# Patient Record
Sex: Female | Born: 1950
Health system: Southern US, Community
[De-identification: ages and names within clinical notes are randomized; demographics above are authoritative.]

## PROBLEM LIST (undated history)

## (undated) DIAGNOSIS — M5126 Other intervertebral disc displacement, lumbar region: Secondary | ICD-10-CM

## (undated) DIAGNOSIS — M7120 Synovial cyst of popliteal space [Baker], unspecified knee: Secondary | ICD-10-CM

## (undated) DIAGNOSIS — I809 Phlebitis and thrombophlebitis of unspecified site: Secondary | ICD-10-CM

## (undated) DIAGNOSIS — R112 Nausea with vomiting, unspecified: Secondary | ICD-10-CM

## (undated) DIAGNOSIS — Z9889 Other specified postprocedural states: Secondary | ICD-10-CM

## (undated) DIAGNOSIS — M51369 Other intervertebral disc degeneration, lumbar region without mention of lumbar back pain or lower extremity pain: Secondary | ICD-10-CM

## (undated) DIAGNOSIS — M5136 Other intervertebral disc degeneration, lumbar region: Secondary | ICD-10-CM

## (undated) DIAGNOSIS — I82409 Acute embolism and thrombosis of unspecified deep veins of unspecified lower extremity: Secondary | ICD-10-CM

## (undated) DIAGNOSIS — J189 Pneumonia, unspecified organism: Secondary | ICD-10-CM

## (undated) DIAGNOSIS — J45909 Unspecified asthma, uncomplicated: Secondary | ICD-10-CM

## (undated) DIAGNOSIS — I10 Essential (primary) hypertension: Secondary | ICD-10-CM

## (undated) HISTORY — DX: Acute embolism and thrombosis of unspecified deep veins of unspecified lower extremity: I82.409

## (undated) HISTORY — PX: APPENDECTOMY: SHX54

## (undated) HISTORY — PX: JOINT REPLACEMENT: SHX530

## (undated) HISTORY — PX: OTHER SURGICAL HISTORY: SHX169

## (undated) HISTORY — DX: Essential (primary) hypertension: I10

## (undated) HISTORY — PX: TUBAL LIGATION: SHX77

## (undated) HISTORY — PX: CHOLECYSTECTOMY: SHX55

## (undated) HISTORY — DX: Unspecified asthma, uncomplicated: J45.909

## (undated) HISTORY — PX: BREAST BIOPSY: SHX20

## (undated) HISTORY — PX: BREAST EXCISIONAL BIOPSY: SUR124

## (undated) HISTORY — PX: BREAST CYST EXCISION: SHX579

---

## 1993-11-05 HISTORY — PX: BRAIN TUMOR EXCISION: SHX577

## 2005-11-05 DIAGNOSIS — M7120 Synovial cyst of popliteal space [Baker], unspecified knee: Secondary | ICD-10-CM

## 2005-11-05 HISTORY — DX: Synovial cyst of popliteal space (Baker), unspecified knee: M71.20

## 2005-11-24 ENCOUNTER — Encounter: Admission: RE | Admit: 2005-11-24 | Discharge: 2005-11-24 | Payer: Self-pay | Admitting: Unknown Physician Specialty

## 2005-12-05 ENCOUNTER — Encounter: Admission: RE | Admit: 2005-12-05 | Discharge: 2005-12-05 | Payer: Self-pay | Admitting: Unknown Physician Specialty

## 2005-12-21 ENCOUNTER — Encounter: Admission: RE | Admit: 2005-12-21 | Discharge: 2005-12-21 | Payer: Self-pay | Admitting: Unknown Physician Specialty

## 2006-01-07 ENCOUNTER — Encounter: Admission: RE | Admit: 2006-01-07 | Discharge: 2006-01-07 | Payer: Self-pay | Admitting: Unknown Physician Specialty

## 2010-10-31 ENCOUNTER — Ambulatory Visit (HOSPITAL_COMMUNITY)
Admission: RE | Admit: 2010-10-31 | Discharge: 2010-10-31 | Payer: Self-pay | Source: Home / Self Care | Attending: Ophthalmology | Admitting: Ophthalmology

## 2010-11-26 ENCOUNTER — Encounter: Payer: Self-pay | Admitting: Unknown Physician Specialty

## 2011-06-21 ENCOUNTER — Other Ambulatory Visit: Payer: Self-pay | Admitting: Neurosurgery

## 2011-06-21 DIAGNOSIS — M5020 Other cervical disc displacement, unspecified cervical region: Secondary | ICD-10-CM

## 2011-06-26 ENCOUNTER — Ambulatory Visit
Admission: RE | Admit: 2011-06-26 | Discharge: 2011-06-26 | Disposition: A | Discharge status: Home / Self Care | Payer: PRIVATE HEALTH INSURANCE | Source: Ambulatory Visit | Attending: Neurosurgery | Admitting: Neurosurgery

## 2011-06-26 VITALS — BP 150/75 | HR 65 | Ht 61.0 in | Wt 312.0 lb

## 2011-06-26 DIAGNOSIS — M5020 Other cervical disc displacement, unspecified cervical region: Secondary | ICD-10-CM

## 2011-06-26 MED ORDER — IOHEXOL 300 MG/ML  SOLN
1.0000 mL | Freq: Once | INTRAMUSCULAR | Status: AC | PRN
  Administered 2011-06-26: 1 mL via EPIDURAL

## 2011-06-26 MED ORDER — TRIAMCINOLONE ACETONIDE 40 MG/ML IJ SUSP (RADIOLOGY)
40.0000 mg | Freq: Once | INTRAMUSCULAR | Status: AC
  Administered 2011-06-26: 40 mg via EPIDURAL

## 2011-07-05 ENCOUNTER — Other Ambulatory Visit: Payer: Self-pay | Admitting: Neurosurgery

## 2011-07-05 DIAGNOSIS — M5020 Other cervical disc displacement, unspecified cervical region: Secondary | ICD-10-CM

## 2011-07-06 ENCOUNTER — Ambulatory Visit
Admission: RE | Admit: 2011-07-06 | Discharge: 2011-07-06 | Disposition: A | Discharge status: Home / Self Care | Payer: PRIVATE HEALTH INSURANCE | Source: Ambulatory Visit | Attending: Neurosurgery | Admitting: Neurosurgery

## 2011-07-06 DIAGNOSIS — M5020 Other cervical disc displacement, unspecified cervical region: Secondary | ICD-10-CM

## 2011-07-06 MED ORDER — IOHEXOL 300 MG/ML  SOLN
1.0000 mL | Freq: Once | INTRAMUSCULAR | Status: AC | PRN
  Administered 2011-07-06: 1 mL via EPIDURAL

## 2011-07-06 MED ORDER — TRIAMCINOLONE ACETONIDE 40 MG/ML IJ SUSP (RADIOLOGY)
60.0000 mg | Freq: Once | INTRAMUSCULAR | Status: AC
  Administered 2011-07-06: 60 mg via EPIDURAL

## 2011-09-10 ENCOUNTER — Other Ambulatory Visit: Payer: Self-pay | Admitting: Neurosurgery

## 2011-09-10 DIAGNOSIS — M5020 Other cervical disc displacement, unspecified cervical region: Secondary | ICD-10-CM

## 2011-09-13 ENCOUNTER — Ambulatory Visit
Admission: RE | Admit: 2011-09-13 | Discharge: 2011-09-13 | Disposition: A | Discharge status: Home / Self Care | Payer: PRIVATE HEALTH INSURANCE | Source: Ambulatory Visit | Attending: Neurosurgery | Admitting: Neurosurgery

## 2011-09-13 DIAGNOSIS — M5020 Other cervical disc displacement, unspecified cervical region: Secondary | ICD-10-CM

## 2011-09-13 MED ORDER — IOHEXOL 300 MG/ML  SOLN
1.0000 mL | Freq: Once | INTRAMUSCULAR | Status: AC | PRN
  Administered 2011-09-13: 1 mL via EPIDURAL

## 2011-09-13 MED ORDER — TRIAMCINOLONE ACETONIDE 40 MG/ML IJ SUSP (RADIOLOGY)
60.0000 mg | Freq: Once | INTRAMUSCULAR | Status: AC
  Administered 2011-09-13: 60 mg via EPIDURAL

## 2011-09-13 MED ORDER — ONDANSETRON HCL 4 MG/2ML IJ SOLN: 4.0000 mg | Freq: Four times a day (QID) | INTRAMUSCULAR | Status: DC | PRN

## 2011-11-19 ENCOUNTER — Telehealth: Payer: Self-pay | Admitting: *Deleted

## 2011-11-19 NOTE — Telephone Encounter (Signed)
Received fax stating can't get 1 ml in fir B12. Is it ok to send 10 ml. Sent updated presciption for 10 ml.Marland KitchenMarland Kitchen1/14/13@4 :42pm/LMB

## 2015-11-30 DIAGNOSIS — S92352A Displaced fracture of fifth metatarsal bone, left foot, initial encounter for closed fracture: Secondary | ICD-10-CM | POA: Diagnosis not present

## 2015-12-19 DIAGNOSIS — H40003 Preglaucoma, unspecified, bilateral: Secondary | ICD-10-CM | POA: Diagnosis not present

## 2015-12-21 DIAGNOSIS — H40003 Preglaucoma, unspecified, bilateral: Secondary | ICD-10-CM | POA: Diagnosis not present

## 2015-12-26 DIAGNOSIS — H401131 Primary open-angle glaucoma, bilateral, mild stage: Secondary | ICD-10-CM | POA: Diagnosis not present

## 2015-12-31 DIAGNOSIS — Z6841 Body Mass Index (BMI) 40.0 and over, adult: Secondary | ICD-10-CM | POA: Diagnosis not present

## 2015-12-31 DIAGNOSIS — M170 Bilateral primary osteoarthritis of knee: Secondary | ICD-10-CM | POA: Diagnosis not present

## 2015-12-31 DIAGNOSIS — K219 Gastro-esophageal reflux disease without esophagitis: Secondary | ICD-10-CM | POA: Diagnosis not present

## 2015-12-31 DIAGNOSIS — K589 Irritable bowel syndrome without diarrhea: Secondary | ICD-10-CM | POA: Diagnosis not present

## 2015-12-31 DIAGNOSIS — Z86011 Personal history of benign neoplasm of the brain: Secondary | ICD-10-CM | POA: Diagnosis not present

## 2015-12-31 DIAGNOSIS — I10 Essential (primary) hypertension: Secondary | ICD-10-CM | POA: Diagnosis not present

## 2015-12-31 DIAGNOSIS — J449 Chronic obstructive pulmonary disease, unspecified: Secondary | ICD-10-CM | POA: Diagnosis not present

## 2015-12-31 DIAGNOSIS — F331 Major depressive disorder, recurrent, moderate: Secondary | ICD-10-CM | POA: Diagnosis not present

## 2016-01-25 DIAGNOSIS — S92352K Displaced fracture of fifth metatarsal bone, left foot, subsequent encounter for fracture with nonunion: Secondary | ICD-10-CM | POA: Diagnosis not present

## 2016-01-25 DIAGNOSIS — S92352A Displaced fracture of fifth metatarsal bone, left foot, initial encounter for closed fracture: Secondary | ICD-10-CM | POA: Diagnosis not present

## 2016-01-25 DIAGNOSIS — M79672 Pain in left foot: Secondary | ICD-10-CM | POA: Diagnosis not present

## 2016-02-01 DIAGNOSIS — E038 Other specified hypothyroidism: Secondary | ICD-10-CM | POA: Diagnosis not present

## 2016-02-01 DIAGNOSIS — M160 Bilateral primary osteoarthritis of hip: Secondary | ICD-10-CM | POA: Diagnosis not present

## 2016-02-01 DIAGNOSIS — Z6841 Body Mass Index (BMI) 40.0 and over, adult: Secondary | ICD-10-CM | POA: Diagnosis not present

## 2016-02-01 DIAGNOSIS — H60393 Other infective otitis externa, bilateral: Secondary | ICD-10-CM | POA: Diagnosis not present

## 2016-02-01 DIAGNOSIS — H8113 Benign paroxysmal vertigo, bilateral: Secondary | ICD-10-CM | POA: Diagnosis not present

## 2016-02-01 DIAGNOSIS — K210 Gastro-esophageal reflux disease with esophagitis: Secondary | ICD-10-CM | POA: Diagnosis not present

## 2016-02-01 DIAGNOSIS — M1049 Other secondary gout, multiple sites: Secondary | ICD-10-CM | POA: Diagnosis not present

## 2016-02-01 DIAGNOSIS — I10 Essential (primary) hypertension: Secondary | ICD-10-CM | POA: Diagnosis not present

## 2016-02-01 DIAGNOSIS — F3289 Other specified depressive episodes: Secondary | ICD-10-CM | POA: Diagnosis not present

## 2016-03-07 DIAGNOSIS — R0602 Shortness of breath: Secondary | ICD-10-CM | POA: Diagnosis not present

## 2016-03-28 DIAGNOSIS — S92352A Displaced fracture of fifth metatarsal bone, left foot, initial encounter for closed fracture: Secondary | ICD-10-CM | POA: Diagnosis not present

## 2016-04-04 DIAGNOSIS — M25511 Pain in right shoulder: Secondary | ICD-10-CM | POA: Diagnosis not present

## 2016-04-04 DIAGNOSIS — M7541 Impingement syndrome of right shoulder: Secondary | ICD-10-CM | POA: Diagnosis not present

## 2016-05-04 DIAGNOSIS — J45909 Unspecified asthma, uncomplicated: Secondary | ICD-10-CM | POA: Diagnosis not present

## 2016-05-04 DIAGNOSIS — M1990 Unspecified osteoarthritis, unspecified site: Secondary | ICD-10-CM | POA: Diagnosis not present

## 2016-05-04 DIAGNOSIS — F418 Other specified anxiety disorders: Secondary | ICD-10-CM | POA: Diagnosis not present

## 2016-05-04 DIAGNOSIS — M109 Gout, unspecified: Secondary | ICD-10-CM | POA: Diagnosis not present

## 2016-05-04 DIAGNOSIS — M1049 Other secondary gout, multiple sites: Secondary | ICD-10-CM | POA: Diagnosis not present

## 2016-05-04 DIAGNOSIS — Z6841 Body Mass Index (BMI) 40.0 and over, adult: Secondary | ICD-10-CM | POA: Diagnosis not present

## 2016-05-04 DIAGNOSIS — J4541 Moderate persistent asthma with (acute) exacerbation: Secondary | ICD-10-CM | POA: Diagnosis not present

## 2016-05-04 DIAGNOSIS — E039 Hypothyroidism, unspecified: Secondary | ICD-10-CM | POA: Diagnosis not present

## 2016-05-04 DIAGNOSIS — J310 Chronic rhinitis: Secondary | ICD-10-CM | POA: Diagnosis not present

## 2016-05-04 DIAGNOSIS — E038 Other specified hypothyroidism: Secondary | ICD-10-CM | POA: Diagnosis not present

## 2016-05-04 DIAGNOSIS — G8929 Other chronic pain: Secondary | ICD-10-CM | POA: Diagnosis not present

## 2016-05-05 DIAGNOSIS — E038 Other specified hypothyroidism: Secondary | ICD-10-CM | POA: Diagnosis not present

## 2016-05-05 DIAGNOSIS — J310 Chronic rhinitis: Secondary | ICD-10-CM | POA: Diagnosis not present

## 2016-05-05 DIAGNOSIS — J4541 Moderate persistent asthma with (acute) exacerbation: Secondary | ICD-10-CM | POA: Diagnosis not present

## 2016-05-05 DIAGNOSIS — F418 Other specified anxiety disorders: Secondary | ICD-10-CM | POA: Diagnosis not present

## 2016-05-05 DIAGNOSIS — M109 Gout, unspecified: Secondary | ICD-10-CM | POA: Diagnosis not present

## 2016-05-05 DIAGNOSIS — G8929 Other chronic pain: Secondary | ICD-10-CM | POA: Diagnosis not present

## 2016-05-05 DIAGNOSIS — M1990 Unspecified osteoarthritis, unspecified site: Secondary | ICD-10-CM | POA: Diagnosis not present

## 2016-05-05 DIAGNOSIS — M1049 Other secondary gout, multiple sites: Secondary | ICD-10-CM | POA: Diagnosis not present

## 2016-05-05 DIAGNOSIS — Z6841 Body Mass Index (BMI) 40.0 and over, adult: Secondary | ICD-10-CM | POA: Diagnosis not present

## 2016-05-05 DIAGNOSIS — E039 Hypothyroidism, unspecified: Secondary | ICD-10-CM | POA: Diagnosis not present

## 2016-05-06 DIAGNOSIS — J4541 Moderate persistent asthma with (acute) exacerbation: Secondary | ICD-10-CM | POA: Diagnosis not present

## 2016-05-06 DIAGNOSIS — G8929 Other chronic pain: Secondary | ICD-10-CM | POA: Diagnosis not present

## 2016-05-06 DIAGNOSIS — E039 Hypothyroidism, unspecified: Secondary | ICD-10-CM | POA: Diagnosis not present

## 2016-05-06 DIAGNOSIS — Z6841 Body Mass Index (BMI) 40.0 and over, adult: Secondary | ICD-10-CM | POA: Diagnosis not present

## 2016-05-06 DIAGNOSIS — F418 Other specified anxiety disorders: Secondary | ICD-10-CM | POA: Diagnosis not present

## 2016-05-06 DIAGNOSIS — J310 Chronic rhinitis: Secondary | ICD-10-CM | POA: Diagnosis not present

## 2016-05-06 DIAGNOSIS — M109 Gout, unspecified: Secondary | ICD-10-CM | POA: Diagnosis not present

## 2016-05-06 DIAGNOSIS — M1990 Unspecified osteoarthritis, unspecified site: Secondary | ICD-10-CM | POA: Diagnosis not present

## 2016-05-07 DIAGNOSIS — Z6841 Body Mass Index (BMI) 40.0 and over, adult: Secondary | ICD-10-CM | POA: Diagnosis not present

## 2016-05-07 DIAGNOSIS — E038 Other specified hypothyroidism: Secondary | ICD-10-CM | POA: Diagnosis not present

## 2016-05-07 DIAGNOSIS — M1049 Other secondary gout, multiple sites: Secondary | ICD-10-CM | POA: Diagnosis not present

## 2016-05-07 DIAGNOSIS — F418 Other specified anxiety disorders: Secondary | ICD-10-CM | POA: Diagnosis not present

## 2016-05-07 DIAGNOSIS — J310 Chronic rhinitis: Secondary | ICD-10-CM | POA: Diagnosis not present

## 2016-05-07 DIAGNOSIS — M1990 Unspecified osteoarthritis, unspecified site: Secondary | ICD-10-CM | POA: Diagnosis not present

## 2016-05-07 DIAGNOSIS — M109 Gout, unspecified: Secondary | ICD-10-CM | POA: Diagnosis not present

## 2016-05-07 DIAGNOSIS — G8929 Other chronic pain: Secondary | ICD-10-CM | POA: Diagnosis not present

## 2016-05-07 DIAGNOSIS — E039 Hypothyroidism, unspecified: Secondary | ICD-10-CM | POA: Diagnosis not present

## 2016-05-07 DIAGNOSIS — J4541 Moderate persistent asthma with (acute) exacerbation: Secondary | ICD-10-CM | POA: Diagnosis not present

## 2016-05-14 DIAGNOSIS — M1049 Other secondary gout, multiple sites: Secondary | ICD-10-CM | POA: Diagnosis not present

## 2016-05-14 DIAGNOSIS — E038 Other specified hypothyroidism: Secondary | ICD-10-CM | POA: Diagnosis not present

## 2016-05-14 DIAGNOSIS — Z6841 Body Mass Index (BMI) 40.0 and over, adult: Secondary | ICD-10-CM | POA: Diagnosis not present

## 2016-05-14 DIAGNOSIS — J4541 Moderate persistent asthma with (acute) exacerbation: Secondary | ICD-10-CM | POA: Diagnosis not present

## 2016-05-14 DIAGNOSIS — I10 Essential (primary) hypertension: Secondary | ICD-10-CM | POA: Diagnosis not present

## 2016-05-14 DIAGNOSIS — M160 Bilateral primary osteoarthritis of hip: Secondary | ICD-10-CM | POA: Diagnosis not present

## 2016-05-14 DIAGNOSIS — K210 Gastro-esophageal reflux disease with esophagitis: Secondary | ICD-10-CM | POA: Diagnosis not present

## 2016-05-14 DIAGNOSIS — F3289 Other specified depressive episodes: Secondary | ICD-10-CM | POA: Diagnosis not present

## 2016-05-16 DIAGNOSIS — M25511 Pain in right shoulder: Secondary | ICD-10-CM | POA: Diagnosis not present

## 2016-05-16 DIAGNOSIS — M7541 Impingement syndrome of right shoulder: Secondary | ICD-10-CM | POA: Diagnosis not present

## 2016-06-07 DIAGNOSIS — I10 Essential (primary) hypertension: Secondary | ICD-10-CM | POA: Diagnosis not present

## 2016-06-07 DIAGNOSIS — J4541 Moderate persistent asthma with (acute) exacerbation: Secondary | ICD-10-CM | POA: Diagnosis not present

## 2016-06-07 DIAGNOSIS — E038 Other specified hypothyroidism: Secondary | ICD-10-CM | POA: Diagnosis not present

## 2016-06-07 DIAGNOSIS — M160 Bilateral primary osteoarthritis of hip: Secondary | ICD-10-CM | POA: Diagnosis not present

## 2016-06-07 DIAGNOSIS — M1049 Other secondary gout, multiple sites: Secondary | ICD-10-CM | POA: Diagnosis not present

## 2016-06-07 DIAGNOSIS — K210 Gastro-esophageal reflux disease with esophagitis: Secondary | ICD-10-CM | POA: Diagnosis not present

## 2016-06-07 DIAGNOSIS — F3289 Other specified depressive episodes: Secondary | ICD-10-CM | POA: Diagnosis not present

## 2016-07-06 DIAGNOSIS — J4541 Moderate persistent asthma with (acute) exacerbation: Secondary | ICD-10-CM | POA: Diagnosis not present

## 2016-07-06 DIAGNOSIS — I10 Essential (primary) hypertension: Secondary | ICD-10-CM | POA: Diagnosis not present

## 2016-07-06 DIAGNOSIS — F3289 Other specified depressive episodes: Secondary | ICD-10-CM | POA: Diagnosis not present

## 2016-07-06 DIAGNOSIS — K210 Gastro-esophageal reflux disease with esophagitis: Secondary | ICD-10-CM | POA: Diagnosis not present

## 2016-07-06 DIAGNOSIS — M1049 Other secondary gout, multiple sites: Secondary | ICD-10-CM | POA: Diagnosis not present

## 2016-07-06 DIAGNOSIS — M160 Bilateral primary osteoarthritis of hip: Secondary | ICD-10-CM | POA: Diagnosis not present

## 2016-07-06 DIAGNOSIS — E038 Other specified hypothyroidism: Secondary | ICD-10-CM | POA: Diagnosis not present

## 2016-08-01 DIAGNOSIS — Z961 Presence of intraocular lens: Secondary | ICD-10-CM | POA: Diagnosis not present

## 2016-08-14 DIAGNOSIS — E038 Other specified hypothyroidism: Secondary | ICD-10-CM | POA: Diagnosis not present

## 2016-08-14 DIAGNOSIS — F3289 Other specified depressive episodes: Secondary | ICD-10-CM | POA: Diagnosis not present

## 2016-08-14 DIAGNOSIS — M160 Bilateral primary osteoarthritis of hip: Secondary | ICD-10-CM | POA: Diagnosis not present

## 2016-08-14 DIAGNOSIS — J4541 Moderate persistent asthma with (acute) exacerbation: Secondary | ICD-10-CM | POA: Diagnosis not present

## 2016-08-14 DIAGNOSIS — K210 Gastro-esophageal reflux disease with esophagitis: Secondary | ICD-10-CM | POA: Diagnosis not present

## 2016-08-14 DIAGNOSIS — M1049 Other secondary gout, multiple sites: Secondary | ICD-10-CM | POA: Diagnosis not present

## 2016-08-14 DIAGNOSIS — I10 Essential (primary) hypertension: Secondary | ICD-10-CM | POA: Diagnosis not present

## 2016-08-14 DIAGNOSIS — R601 Generalized edema: Secondary | ICD-10-CM | POA: Diagnosis not present

## 2016-08-23 DIAGNOSIS — E039 Hypothyroidism, unspecified: Secondary | ICD-10-CM | POA: Diagnosis not present

## 2016-08-23 DIAGNOSIS — J45909 Unspecified asthma, uncomplicated: Secondary | ICD-10-CM | POA: Diagnosis not present

## 2016-08-23 DIAGNOSIS — K219 Gastro-esophageal reflux disease without esophagitis: Secondary | ICD-10-CM | POA: Diagnosis not present

## 2016-08-23 DIAGNOSIS — M109 Gout, unspecified: Secondary | ICD-10-CM | POA: Diagnosis not present

## 2016-08-23 DIAGNOSIS — I10 Essential (primary) hypertension: Secondary | ICD-10-CM | POA: Diagnosis not present

## 2016-08-23 DIAGNOSIS — Z79899 Other long term (current) drug therapy: Secondary | ICD-10-CM | POA: Diagnosis not present

## 2016-08-23 DIAGNOSIS — F419 Anxiety disorder, unspecified: Secondary | ICD-10-CM | POA: Diagnosis not present

## 2016-08-23 DIAGNOSIS — F329 Major depressive disorder, single episode, unspecified: Secondary | ICD-10-CM | POA: Diagnosis not present

## 2016-08-23 DIAGNOSIS — I890 Lymphedema, not elsewhere classified: Secondary | ICD-10-CM | POA: Diagnosis not present

## 2016-09-04 DIAGNOSIS — K210 Gastro-esophageal reflux disease with esophagitis: Secondary | ICD-10-CM | POA: Diagnosis not present

## 2016-09-04 DIAGNOSIS — J441 Chronic obstructive pulmonary disease with (acute) exacerbation: Secondary | ICD-10-CM | POA: Diagnosis not present

## 2016-09-04 DIAGNOSIS — F3289 Other specified depressive episodes: Secondary | ICD-10-CM | POA: Diagnosis not present

## 2016-09-04 DIAGNOSIS — M160 Bilateral primary osteoarthritis of hip: Secondary | ICD-10-CM | POA: Diagnosis not present

## 2016-09-04 DIAGNOSIS — I10 Essential (primary) hypertension: Secondary | ICD-10-CM | POA: Diagnosis not present

## 2016-09-04 DIAGNOSIS — E038 Other specified hypothyroidism: Secondary | ICD-10-CM | POA: Diagnosis not present

## 2016-09-06 DIAGNOSIS — I890 Lymphedema, not elsewhere classified: Secondary | ICD-10-CM | POA: Diagnosis not present

## 2016-09-07 DIAGNOSIS — I890 Lymphedema, not elsewhere classified: Secondary | ICD-10-CM | POA: Diagnosis not present

## 2016-09-11 DIAGNOSIS — I890 Lymphedema, not elsewhere classified: Secondary | ICD-10-CM | POA: Diagnosis not present

## 2016-09-18 DIAGNOSIS — I890 Lymphedema, not elsewhere classified: Secondary | ICD-10-CM | POA: Diagnosis not present

## 2016-09-21 DIAGNOSIS — I890 Lymphedema, not elsewhere classified: Secondary | ICD-10-CM | POA: Diagnosis not present

## 2016-09-24 DIAGNOSIS — I890 Lymphedema, not elsewhere classified: Secondary | ICD-10-CM | POA: Diagnosis not present

## 2016-09-25 DIAGNOSIS — I890 Lymphedema, not elsewhere classified: Secondary | ICD-10-CM | POA: Diagnosis not present

## 2016-09-26 DIAGNOSIS — F3289 Other specified depressive episodes: Secondary | ICD-10-CM | POA: Diagnosis not present

## 2016-09-26 DIAGNOSIS — I10 Essential (primary) hypertension: Secondary | ICD-10-CM | POA: Diagnosis not present

## 2016-09-26 DIAGNOSIS — K210 Gastro-esophageal reflux disease with esophagitis: Secondary | ICD-10-CM | POA: Diagnosis not present

## 2016-09-26 DIAGNOSIS — J441 Chronic obstructive pulmonary disease with (acute) exacerbation: Secondary | ICD-10-CM | POA: Diagnosis not present

## 2016-09-26 DIAGNOSIS — E038 Other specified hypothyroidism: Secondary | ICD-10-CM | POA: Diagnosis not present

## 2016-09-26 DIAGNOSIS — M160 Bilateral primary osteoarthritis of hip: Secondary | ICD-10-CM | POA: Diagnosis not present

## 2016-10-01 DIAGNOSIS — I890 Lymphedema, not elsewhere classified: Secondary | ICD-10-CM | POA: Diagnosis not present

## 2016-10-05 ENCOUNTER — Other Ambulatory Visit (HOSPITAL_COMMUNITY): Payer: Self-pay | Admitting: Surgery

## 2016-10-05 DIAGNOSIS — I890 Lymphedema, not elsewhere classified: Secondary | ICD-10-CM | POA: Diagnosis not present

## 2016-10-09 DIAGNOSIS — I890 Lymphedema, not elsewhere classified: Secondary | ICD-10-CM | POA: Diagnosis not present

## 2016-10-10 ENCOUNTER — Encounter: Payer: Commercial Managed Care - HMO | Attending: Surgery | Admitting: Dietician

## 2016-10-10 ENCOUNTER — Encounter: Payer: Self-pay | Admitting: Dietician

## 2016-10-10 DIAGNOSIS — Z713 Dietary counseling and surveillance: Secondary | ICD-10-CM | POA: Insufficient documentation

## 2016-10-10 DIAGNOSIS — E669 Obesity, unspecified: Secondary | ICD-10-CM

## 2016-10-10 NOTE — Progress Notes (Addendum)
  Pre-Op Assessment Visit:  Pre-Operative Sleeve gastrectomy Surgery  Medical Nutrition Therapy:  Appt start time: 1035   End time:  1135  Patient was seen on 10/10/2016 for Pre-Operative Nutrition Assessment. Assessment and letter of approval [facility]ed to [facility] coordinator on 10/10/2016.   Preferred Learning Style:   No preference indicated   Learning Readiness:   Ready  Handouts given during visit include:  Pre-Op Goals Bariatric Surgery Protein Shakes  Encouraged to engage in 90 minutes of moderate physical activity including cardiovascular and weight baring weekly  -Follow diet recommendations listed below   Energy and Macronutrient Recomendations: Calories: 1400-1600 Carbohydrate: 158-180 Protein: 105-120 Fat: 39-44   During the appointment today the following Pre-Op Goals were reviewed with the patient: Maintain or lose weight as instructed by your surgeon Make healthy food choices Begin to limit portion sizes Limited concentrated sugars and fried foods Keep fat/sugar in the single digits per serving on   food labels Practice CHEWING your food  (aim for 30 chews per bite or until applesauce consistency) Practice not drinking 15 minutes before, during, and 30 minutes after each meal/snack Avoid all carbonated beverages  Avoid/limit caffeinated beverages  Avoid all sugar-sweetened beverages Consume 3 meals per day; eat every 3-5 hours Make a list of non-food related activities Aim for 64-100 ounces of FLUID daily  Aim for at least 60-80 grams of PROTEIN daily Look for a liquid protein source that contain ?15 g protein and ?5 g carbohydrate  (ex: shakes, drinks, shots)  Demonstrated degree of understanding via:  Teach Back  Teaching Method Utilized:  Visual Auditory Hands on  Barriers to learning/adherence to lifestyle change: food preferences  Patient to call the [facility] to enroll in  Pre-Op and Post-Op Nutrition Education when surgery date is scheduled.

## 2016-10-11 DIAGNOSIS — I890 Lymphedema, not elsewhere classified: Secondary | ICD-10-CM | POA: Diagnosis not present

## 2016-10-15 DIAGNOSIS — I890 Lymphedema, not elsewhere classified: Secondary | ICD-10-CM | POA: Diagnosis not present

## 2016-10-16 DIAGNOSIS — E038 Other specified hypothyroidism: Secondary | ICD-10-CM | POA: Diagnosis not present

## 2016-10-16 DIAGNOSIS — M160 Bilateral primary osteoarthritis of hip: Secondary | ICD-10-CM | POA: Diagnosis not present

## 2016-10-16 DIAGNOSIS — F3289 Other specified depressive episodes: Secondary | ICD-10-CM | POA: Diagnosis not present

## 2016-10-16 DIAGNOSIS — K210 Gastro-esophageal reflux disease with esophagitis: Secondary | ICD-10-CM | POA: Diagnosis not present

## 2016-10-16 DIAGNOSIS — J441 Chronic obstructive pulmonary disease with (acute) exacerbation: Secondary | ICD-10-CM | POA: Diagnosis not present

## 2016-10-16 DIAGNOSIS — I10 Essential (primary) hypertension: Secondary | ICD-10-CM | POA: Diagnosis not present

## 2016-10-17 DIAGNOSIS — M7541 Impingement syndrome of right shoulder: Secondary | ICD-10-CM | POA: Diagnosis not present

## 2016-10-17 DIAGNOSIS — M25511 Pain in right shoulder: Secondary | ICD-10-CM | POA: Diagnosis not present

## 2016-10-19 DIAGNOSIS — I890 Lymphedema, not elsewhere classified: Secondary | ICD-10-CM | POA: Diagnosis not present

## 2016-10-26 DIAGNOSIS — I890 Lymphedema, not elsewhere classified: Secondary | ICD-10-CM | POA: Diagnosis not present

## 2016-11-06 DIAGNOSIS — I890 Lymphedema, not elsewhere classified: Secondary | ICD-10-CM | POA: Diagnosis not present

## 2016-11-08 DIAGNOSIS — I890 Lymphedema, not elsewhere classified: Secondary | ICD-10-CM | POA: Diagnosis not present

## 2016-11-16 DIAGNOSIS — I890 Lymphedema, not elsewhere classified: Secondary | ICD-10-CM | POA: Diagnosis not present

## 2016-11-20 DIAGNOSIS — I890 Lymphedema, not elsewhere classified: Secondary | ICD-10-CM | POA: Diagnosis not present

## 2016-11-23 DIAGNOSIS — F3289 Other specified depressive episodes: Secondary | ICD-10-CM | POA: Diagnosis not present

## 2016-11-23 DIAGNOSIS — M1049 Other secondary gout, multiple sites: Secondary | ICD-10-CM | POA: Diagnosis not present

## 2016-11-23 DIAGNOSIS — K210 Gastro-esophageal reflux disease with esophagitis: Secondary | ICD-10-CM | POA: Diagnosis not present

## 2016-11-23 DIAGNOSIS — I890 Lymphedema, not elsewhere classified: Secondary | ICD-10-CM | POA: Diagnosis not present

## 2016-11-23 DIAGNOSIS — I10 Essential (primary) hypertension: Secondary | ICD-10-CM | POA: Diagnosis not present

## 2016-11-23 DIAGNOSIS — Z6841 Body Mass Index (BMI) 40.0 and over, adult: Secondary | ICD-10-CM | POA: Diagnosis not present

## 2016-11-23 DIAGNOSIS — E038 Other specified hypothyroidism: Secondary | ICD-10-CM | POA: Diagnosis not present

## 2016-11-28 DIAGNOSIS — K210 Gastro-esophageal reflux disease with esophagitis: Secondary | ICD-10-CM | POA: Diagnosis not present

## 2016-11-28 DIAGNOSIS — I10 Essential (primary) hypertension: Secondary | ICD-10-CM | POA: Diagnosis not present

## 2016-11-28 DIAGNOSIS — M160 Bilateral primary osteoarthritis of hip: Secondary | ICD-10-CM | POA: Diagnosis not present

## 2016-11-28 DIAGNOSIS — J441 Chronic obstructive pulmonary disease with (acute) exacerbation: Secondary | ICD-10-CM | POA: Diagnosis not present

## 2016-11-28 DIAGNOSIS — E038 Other specified hypothyroidism: Secondary | ICD-10-CM | POA: Diagnosis not present

## 2016-11-28 DIAGNOSIS — F3289 Other specified depressive episodes: Secondary | ICD-10-CM | POA: Diagnosis not present

## 2016-12-05 DIAGNOSIS — I890 Lymphedema, not elsewhere classified: Secondary | ICD-10-CM | POA: Diagnosis not present

## 2016-12-18 DIAGNOSIS — F3289 Other specified depressive episodes: Secondary | ICD-10-CM | POA: Diagnosis not present

## 2016-12-18 DIAGNOSIS — E038 Other specified hypothyroidism: Secondary | ICD-10-CM | POA: Diagnosis not present

## 2016-12-18 DIAGNOSIS — K210 Gastro-esophageal reflux disease with esophagitis: Secondary | ICD-10-CM | POA: Diagnosis not present

## 2016-12-18 DIAGNOSIS — I10 Essential (primary) hypertension: Secondary | ICD-10-CM | POA: Diagnosis not present

## 2016-12-18 DIAGNOSIS — M160 Bilateral primary osteoarthritis of hip: Secondary | ICD-10-CM | POA: Diagnosis not present

## 2016-12-18 DIAGNOSIS — J441 Chronic obstructive pulmonary disease with (acute) exacerbation: Secondary | ICD-10-CM | POA: Diagnosis not present

## 2016-12-25 DIAGNOSIS — I10 Essential (primary) hypertension: Secondary | ICD-10-CM | POA: Diagnosis not present

## 2016-12-25 DIAGNOSIS — Z6841 Body Mass Index (BMI) 40.0 and over, adult: Secondary | ICD-10-CM | POA: Diagnosis not present

## 2017-01-08 DIAGNOSIS — I10 Essential (primary) hypertension: Secondary | ICD-10-CM | POA: Diagnosis not present

## 2017-01-08 DIAGNOSIS — E038 Other specified hypothyroidism: Secondary | ICD-10-CM | POA: Diagnosis not present

## 2017-01-08 DIAGNOSIS — M160 Bilateral primary osteoarthritis of hip: Secondary | ICD-10-CM | POA: Diagnosis not present

## 2017-01-08 DIAGNOSIS — K210 Gastro-esophageal reflux disease with esophagitis: Secondary | ICD-10-CM | POA: Diagnosis not present

## 2017-01-08 DIAGNOSIS — J441 Chronic obstructive pulmonary disease with (acute) exacerbation: Secondary | ICD-10-CM | POA: Diagnosis not present

## 2017-01-10 ENCOUNTER — Ambulatory Visit (HOSPITAL_COMMUNITY): Payer: PRIVATE HEALTH INSURANCE

## 2017-01-11 DIAGNOSIS — M1990 Unspecified osteoarthritis, unspecified site: Secondary | ICD-10-CM | POA: Diagnosis not present

## 2017-01-11 DIAGNOSIS — E039 Hypothyroidism, unspecified: Secondary | ICD-10-CM | POA: Diagnosis not present

## 2017-01-11 DIAGNOSIS — K219 Gastro-esophageal reflux disease without esophagitis: Secondary | ICD-10-CM | POA: Diagnosis not present

## 2017-01-11 DIAGNOSIS — I10 Essential (primary) hypertension: Secondary | ICD-10-CM | POA: Diagnosis not present

## 2017-01-11 DIAGNOSIS — R7303 Prediabetes: Secondary | ICD-10-CM | POA: Diagnosis not present

## 2017-01-15 ENCOUNTER — Other Ambulatory Visit (HOSPITAL_COMMUNITY): Payer: PRIVATE HEALTH INSURANCE

## 2017-01-15 ENCOUNTER — Ambulatory Visit (HOSPITAL_COMMUNITY): Payer: PRIVATE HEALTH INSURANCE

## 2017-01-17 ENCOUNTER — Ambulatory Visit (HOSPITAL_COMMUNITY)
Admission: RE | Admit: 2017-01-17 | Discharge: 2017-01-17 | Disposition: A | Discharge status: Home / Self Care | Payer: Commercial Managed Care - HMO | Source: Ambulatory Visit | Attending: Surgery | Admitting: Surgery

## 2017-01-17 ENCOUNTER — Other Ambulatory Visit: Payer: Self-pay

## 2017-01-17 DIAGNOSIS — Z01818 Encounter for other preprocedural examination: Secondary | ICD-10-CM | POA: Diagnosis not present

## 2017-01-22 DIAGNOSIS — I10 Essential (primary) hypertension: Secondary | ICD-10-CM | POA: Diagnosis not present

## 2017-01-22 DIAGNOSIS — Z6841 Body Mass Index (BMI) 40.0 and over, adult: Secondary | ICD-10-CM | POA: Diagnosis not present

## 2017-02-07 DIAGNOSIS — Z6841 Body Mass Index (BMI) 40.0 and over, adult: Secondary | ICD-10-CM | POA: Diagnosis not present

## 2017-02-07 DIAGNOSIS — M542 Cervicalgia: Secondary | ICD-10-CM | POA: Diagnosis not present

## 2017-02-11 DIAGNOSIS — F3289 Other specified depressive episodes: Secondary | ICD-10-CM | POA: Diagnosis not present

## 2017-02-11 DIAGNOSIS — M160 Bilateral primary osteoarthritis of hip: Secondary | ICD-10-CM | POA: Diagnosis not present

## 2017-02-11 DIAGNOSIS — E038 Other specified hypothyroidism: Secondary | ICD-10-CM | POA: Diagnosis not present

## 2017-02-11 DIAGNOSIS — I10 Essential (primary) hypertension: Secondary | ICD-10-CM | POA: Diagnosis not present

## 2017-02-11 DIAGNOSIS — J441 Chronic obstructive pulmonary disease with (acute) exacerbation: Secondary | ICD-10-CM | POA: Diagnosis not present

## 2017-02-11 DIAGNOSIS — K210 Gastro-esophageal reflux disease with esophagitis: Secondary | ICD-10-CM | POA: Diagnosis not present

## 2017-02-15 DIAGNOSIS — M2548 Effusion, other site: Secondary | ICD-10-CM | POA: Diagnosis not present

## 2017-02-15 DIAGNOSIS — M47812 Spondylosis without myelopathy or radiculopathy, cervical region: Secondary | ICD-10-CM | POA: Diagnosis not present

## 2017-02-15 DIAGNOSIS — M47811 Spondylosis without myelopathy or radiculopathy, occipito-atlanto-axial region: Secondary | ICD-10-CM | POA: Diagnosis not present

## 2017-02-22 DIAGNOSIS — Z6841 Body Mass Index (BMI) 40.0 and over, adult: Secondary | ICD-10-CM | POA: Diagnosis not present

## 2017-02-22 DIAGNOSIS — M542 Cervicalgia: Secondary | ICD-10-CM | POA: Diagnosis not present

## 2017-02-22 DIAGNOSIS — E6609 Other obesity due to excess calories: Secondary | ICD-10-CM | POA: Diagnosis not present

## 2017-02-22 DIAGNOSIS — Z0000 Encounter for general adult medical examination without abnormal findings: Secondary | ICD-10-CM | POA: Diagnosis not present

## 2017-02-22 DIAGNOSIS — Z1389 Encounter for screening for other disorder: Secondary | ICD-10-CM | POA: Diagnosis not present

## 2017-03-05 DIAGNOSIS — I10 Essential (primary) hypertension: Secondary | ICD-10-CM | POA: Diagnosis not present

## 2017-03-05 DIAGNOSIS — M160 Bilateral primary osteoarthritis of hip: Secondary | ICD-10-CM | POA: Diagnosis not present

## 2017-03-05 DIAGNOSIS — E038 Other specified hypothyroidism: Secondary | ICD-10-CM | POA: Diagnosis not present

## 2017-03-05 DIAGNOSIS — J441 Chronic obstructive pulmonary disease with (acute) exacerbation: Secondary | ICD-10-CM | POA: Diagnosis not present

## 2017-03-19 ENCOUNTER — Ambulatory Visit: Payer: PRIVATE HEALTH INSURANCE | Admitting: Psychiatry

## 2017-03-25 DIAGNOSIS — I10 Essential (primary) hypertension: Secondary | ICD-10-CM | POA: Diagnosis not present

## 2017-03-25 DIAGNOSIS — E6609 Other obesity due to excess calories: Secondary | ICD-10-CM | POA: Diagnosis not present

## 2017-03-25 DIAGNOSIS — Z6841 Body Mass Index (BMI) 40.0 and over, adult: Secondary | ICD-10-CM | POA: Diagnosis not present

## 2017-04-05 DIAGNOSIS — E038 Other specified hypothyroidism: Secondary | ICD-10-CM | POA: Diagnosis not present

## 2017-04-05 DIAGNOSIS — I10 Essential (primary) hypertension: Secondary | ICD-10-CM | POA: Diagnosis not present

## 2017-04-05 DIAGNOSIS — J441 Chronic obstructive pulmonary disease with (acute) exacerbation: Secondary | ICD-10-CM | POA: Diagnosis not present

## 2017-04-05 DIAGNOSIS — M160 Bilateral primary osteoarthritis of hip: Secondary | ICD-10-CM | POA: Diagnosis not present

## 2017-04-25 ENCOUNTER — Ambulatory Visit (INDEPENDENT_AMBULATORY_CARE_PROVIDER_SITE_OTHER): Payer: Medicare HMO | Admitting: Psychiatry

## 2017-04-25 DIAGNOSIS — F509 Eating disorder, unspecified: Secondary | ICD-10-CM

## 2017-04-29 DIAGNOSIS — Z6841 Body Mass Index (BMI) 40.0 and over, adult: Secondary | ICD-10-CM | POA: Diagnosis not present

## 2017-04-29 DIAGNOSIS — E6609 Other obesity due to excess calories: Secondary | ICD-10-CM | POA: Diagnosis not present

## 2017-04-29 DIAGNOSIS — I10 Essential (primary) hypertension: Secondary | ICD-10-CM | POA: Diagnosis not present

## 2017-05-15 ENCOUNTER — Ambulatory Visit (INDEPENDENT_AMBULATORY_CARE_PROVIDER_SITE_OTHER): Payer: Medicare HMO | Admitting: Psychiatry

## 2017-05-15 DIAGNOSIS — F509 Eating disorder, unspecified: Secondary | ICD-10-CM

## 2017-05-30 DIAGNOSIS — Z6841 Body Mass Index (BMI) 40.0 and over, adult: Secondary | ICD-10-CM | POA: Diagnosis not present

## 2017-05-30 DIAGNOSIS — E6609 Other obesity due to excess calories: Secondary | ICD-10-CM | POA: Diagnosis not present

## 2017-05-30 DIAGNOSIS — I10 Essential (primary) hypertension: Secondary | ICD-10-CM | POA: Diagnosis not present

## 2017-06-17 DIAGNOSIS — J441 Chronic obstructive pulmonary disease with (acute) exacerbation: Secondary | ICD-10-CM | POA: Diagnosis not present

## 2017-06-17 DIAGNOSIS — I10 Essential (primary) hypertension: Secondary | ICD-10-CM | POA: Diagnosis not present

## 2017-06-17 DIAGNOSIS — E038 Other specified hypothyroidism: Secondary | ICD-10-CM | POA: Diagnosis not present

## 2017-06-17 DIAGNOSIS — M160 Bilateral primary osteoarthritis of hip: Secondary | ICD-10-CM | POA: Diagnosis not present

## 2017-06-20 ENCOUNTER — Encounter: Payer: Self-pay | Admitting: Registered"

## 2017-06-20 ENCOUNTER — Encounter: Payer: Medicare HMO | Attending: Surgery | Admitting: Registered"

## 2017-06-20 DIAGNOSIS — E669 Obesity, unspecified: Secondary | ICD-10-CM

## 2017-06-20 DIAGNOSIS — Z713 Dietary counseling and surveillance: Secondary | ICD-10-CM | POA: Diagnosis not present

## 2017-06-20 DIAGNOSIS — Z6841 Body Mass Index (BMI) 40.0 and over, adult: Secondary | ICD-10-CM | POA: Insufficient documentation

## 2017-06-20 NOTE — Progress Notes (Signed)
Pre-Op Assessment Visit:  Pre-Operative Sleeve Gastrectomy Surgery  Medical Nutrition Therapy:  Appt start time: 8:35  End time:  9:45  Patient was seen on 06/20/2017 for Pre-Operative Nutrition Assessment. Assessment and letter of approval [facility] to [facility] coordinator on 06/20/2017.   Pt expectation of surgery: to be healthier, to be 200 lbs or less  Pt expectation of Dietitian: help with being prepared for surgery  Start weight at [facility]: 298.5  BMI: 57.34   Pt arrives with husband and states she was here in Dec 2017 for assessment. Pt states she has completed 6 SWL visits with PCP and insurance requires "another evaluation" with us prior to surgery. Pt states she has gone from 342 lbs to 297 lbs since 2017. Pt states she drinks tea rarely and it is half and half sweet/unsweet tea; has eliminated bread. Pt states she has history of diverticulities; reactions to corn, tomatoes, okra, green beans. Pt states she exercises daily with weight-bearing activities. Pt reports aiming to not eat anything after 8pm. Pt states she is scheduled for pre-op class on Mon and surgery date is 9/18.  Per insurance, pt needs 0 SWL visits prior to surgery. Pt states she has completed 6 SWL visits with PCP.    24 hr Dietary Recall: First Meal: cereal with 2% milk, fried hot dog, slice of white wheat bread Snack: none Second Meal: protein shake Snack: none Third Meal: burger, cheese, chili, slaw (without bun), protein chips, greek yogurt Snack: sometimes fruit Beverages: water, white cranberry juice, 2% milk, decaf coffee  Encouraged to engage in 150 minutes of moderate physical activity including cardiovascular and weight baring weekly  Handouts given during visit include:  . Pre-Op Goals . Bariatric Surgery Protein Shakes . Vitamin and Mineral Recommendations  During the appointment today the following Pre-Op Goals were reviewed with the patient: . Maintain  or lose weight as instructed by your surgeon . Make healthy food choices . Begin to limit portion sizes . Limited concentrated sugars and fried foods . Keep fat/sugar in the single digits per serving on         food labels . Practice CHEWING your food  (aim for 30 chews per bite or until applesauce consistency) . Practice not drinking 15 minutes before, during, and 30 minutes after each meal/snack . Avoid all carbonated beverages  . Avoid/limit caffeinated beverages  . Avoid all sugar-sweetened beverages . Consume 3 meals per day; eat every 3-5 hours . Make a list of non-food related activities . Aim for 64-100 ounces of FLUID daily  . Aim for at least 60-80 grams of PROTEIN daily . Look for a liquid protein source that contain ?15 g protein and ?5 g carbohydrate  (ex: shakes, drinks, shots) . Physical activity is an important part of a healthy lifestyle so keep it moving!  Follow diet recommendations listed below Energy and Macronutrient Recommendations: Calories: 1600 Carbohydrate: 180 Protein: 120 Fat: 44  Demonstrated degree of understanding via:  Teach Back   Teaching Method Utilized:  Visual Auditory  Barriers to learning/adherence to lifestyle change: none  Patient to call the [facility] to enroll in Pre-Op and Post-Op Nutrition Education when surgery date is scheduled.

## 2017-06-21 ENCOUNTER — Telehealth: Payer: Self-pay | Admitting: Registered"

## 2017-06-21 NOTE — Telephone Encounter (Signed)
Pt called to verify if current Equate multivitamin complete was sufficient to take prior to surgery. RD confirmed.

## 2017-06-24 ENCOUNTER — Encounter: Payer: Medicare HMO | Admitting: Registered"

## 2017-06-24 DIAGNOSIS — Z713 Dietary counseling and surveillance: Secondary | ICD-10-CM | POA: Diagnosis not present

## 2017-06-24 DIAGNOSIS — E669 Obesity, unspecified: Secondary | ICD-10-CM

## 2017-06-24 DIAGNOSIS — Z6841 Body Mass Index (BMI) 40.0 and over, adult: Secondary | ICD-10-CM | POA: Diagnosis not present

## 2017-06-24 NOTE — Progress Notes (Signed)
  Pre-Operative Nutrition Class:  Appt start time: 8:15  End time:  9:15  Patient was seen on 06/24/2017 for Pre-Operative Bariatric Surgery Education at the [facility].   Surgery date: TBD Surgery type: Sleeve gastrectomy Start weight at [facility]: 335.0 Weight today: 299.2   Samples given per MNT protocol. Patient educated on appropriate usage: Bariatric Advantage Multivitamin Lot # [id number] Exp: 04/2018  Bariatric Advantage Calcium Citrate Lot # [id number] Exp: 07/31/2017  Renee Pain Protein Powder Lot # [id number] Exp: 01/2017   The following the learning objectives were met by the patient during this course:  Identify Pre-Op Dietary Goals and will begin 2 weeks pre-operatively  Identify appropriate sources of fluids and proteins   State protein recommendations and appropriate sources pre and post-operatively  Identify Post-Operative Dietary Goals and will follow for 2 weeks post-operatively  Identify appropriate multivitamin and calcium sources  Describe the need for physical activity post-operatively and will follow MD recommendations  State when to call healthcare provider regarding medication questions or post-operative complications  Handouts given during class include:  Pre-Op Bariatric Surgery Diet Handout  Protein Shake Handout  Post-Op Bariatric Surgery Nutrition Handout  [facility] Program Information Flyer  Support Group Information Flyer  [facility] Bariatric Supplements Price List  Follow-Up Plan: Patient will follow-up at [facility] 2 weeks post operatively for diet advancement per MD.

## 2017-06-26 ENCOUNTER — Telehealth: Payer: Self-pay | Admitting: Registered"

## 2017-06-26 NOTE — Telephone Encounter (Signed)
Pt called to verify MVI information. Pt states she started taking Equate MVI last Thursday and experienced some diarrhea yesterday after eating a lot of beets and cabbage. Pt states she "is nervous she will mess up something". Pt states since her brain tumor, she handles one-on-one appointments better than group classes especially when there is a lot of information being given. RD encouraged pt that we are here to help in any way and will work with her one-on-one as needed. Pt also asked about certain foods causing gas as well as eating potatoes contained within frozen meals. RD educated pt on foods causing gas as well as to not eat starchy items within frozen meals during pre-op diet.

## 2017-06-27 ENCOUNTER — Ambulatory Visit: Payer: Self-pay | Admitting: Surgery

## 2017-07-12 DIAGNOSIS — M160 Bilateral primary osteoarthritis of hip: Secondary | ICD-10-CM | POA: Diagnosis not present

## 2017-07-12 DIAGNOSIS — J441 Chronic obstructive pulmonary disease with (acute) exacerbation: Secondary | ICD-10-CM | POA: Diagnosis not present

## 2017-07-12 DIAGNOSIS — E038 Other specified hypothyroidism: Secondary | ICD-10-CM | POA: Diagnosis not present

## 2017-07-12 DIAGNOSIS — I10 Essential (primary) hypertension: Secondary | ICD-10-CM | POA: Diagnosis not present

## 2017-07-16 NOTE — Patient Instructions (Addendum)
Federico FlakeJanice C Loth  07/16/2017   Your procedure is scheduled on: 07-23-17  [facility]  Entrance [place]  elevators to 3rd floor to  Short Stay Center at 1030AM.   Call this number if you have problems the morning of surgery [phone]    Remember: ONLY 1 PERSON MAY GO WITH YOU TO SHORT STAY TO GET  READY MORNING OF YOUR SURGERY.  Do not eat food or drink liquids :After Midnight.     Take these medicines the morning of surgery with A SIP OF WATER: tylenol as needed, inhalers as needed, allopurinol, amlodipine, levothyroxine, loratadine, pantoprazole                                 You may not have any metal on your body including hair pins and              piercings  Do not wear jewelry, make-up, lotions, powders or perfumes, deodorant             Do not wear nail polish.  Do not shave  48 hours prior to surgery.     Do not bring valuables to the hospital. [place] IS NOT             RESPONSIBLE   FOR VALUABLES.  Contacts, dentures or bridgework may not be worn into surgery.  Leave suitcase in the car. After surgery it may be brought to your room.                Please read over the following fact sheets you were given: _____________________________________________________________________           [facility] - Preparing for [facility] Before surgery, you can play an important role.  Because skin is not sterile, your skin needs to be as free of germs as possible.  You can reduce the number of germs on your skin by washing with CHG (chlorahexidine gluconate) soap before surgery.  CHG is an antiseptic cleaner which kills germs and bonds with the skin to continue killing germs even after washing. Please DO NOT use if you have an allergy to CHG or antibacterial soaps.  If your skin becomes reddened/irritated stop using the CHG and inform your nurse when you arrive at Short Stay. Do not shave (including legs and underarms) for at least 48 hours  prior to the first CHG shower.  You may shave your face/neck. Please follow these instructions carefully:  1.  Shower with CHG Soap the night before surgery and the  morning of Surgery.  2.  If you choose to wash your hair, wash your hair first as usual with your  normal  shampoo.  3.  After you shampoo, rinse your hair and body thoroughly to remove the  shampoo.                           4.  Use CHG as you would any other liquid soap.  You can apply chg directly  to the skin and wash                       Gently with a scrungie or clean washcloth.  5.  Apply the CHG Soap to your body ONLY FROM THE NECK DOWN.  Do not use on face/ open                           Wound or open sores. Avoid contact with eyes, ears mouth and genitals (private parts).                       Wash face,  Genitals (private parts) with your normal soap.             6.  Wash thoroughly, paying special attention to the area where your surgery  will be performed.  7.  Thoroughly rinse your body with warm water from the neck down.  8.  DO NOT shower/wash with your normal soap after using and rinsing off  the CHG Soap.                9.  Pat yourself dry with a clean towel.            10.  Wear clean pajamas.            11.  Place clean sheets on your bed the night of your first shower and do not  sleep with pets. Day of Surgery : Do not apply any lotions/deodorants the morning of surgery.  Please wear clean clothes to the hospital/surgery center.  FAILURE TO FOLLOW THESE INSTRUCTIONS MAY RESULT IN THE CANCELLATION OF YOUR SURGERY PATIENT SIGNATURE_________________________________  NURSE SIGNATURE__________________________________  ________________________________________________________________________   Adam Phenix  An incentive spirometer is a tool that can help keep your lungs clear and active. This tool measures how well you are filling your lungs with each breath. Taking long deep breaths may help reverse  or decrease the chance of developing breathing (pulmonary) problems (especially infection) following:  A long period of time when you are unable to move or be active. BEFORE THE PROCEDURE   If the spirometer includes an indicator to show your best effort, your nurse or respiratory therapist will set it to a desired goal.  If possible, sit up straight or lean slightly forward. Try not to slouch.  Hold the incentive spirometer in an upright position. INSTRUCTIONS FOR USE  1. Sit on the edge of your bed if possible, or sit up as far as you can in bed or on a chair. 2. Hold the incentive spirometer in an upright position. 3. Breathe out normally. 4. Place the mouthpiece in your mouth and seal your lips tightly around it. 5. Breathe in slowly and as deeply as possible, raising the piston or the ball toward the top of the column. 6. Hold your breath for 3-5 seconds or for as long as possible. Allow the piston or ball to fall to the bottom of the column. 7. Remove the mouthpiece from your mouth and breathe out normally. 8. Rest for a few seconds and repeat Steps 1 through 7 at least 10 times every 1-2 hours when you are awake. Take your time and take a few normal breaths between deep breaths. 9. The spirometer may include an indicator to show your best effort. Use the indicator as a goal to work toward during each repetition. 10. After each set of 10 deep breaths, practice coughing to be sure your lungs are clear. If you have an incision (the cut made at the time of surgery), support your incision when coughing by placing a pillow or rolled up towels firmly against it. Once you are able to get out of  bed, walk around indoors and cough well. You may stop using the incentive spirometer when instructed by your caregiver.  RISKS AND COMPLICATIONS  Take your time so you do not get dizzy or light-headed.  If you are in pain, you may need to take or ask for pain medication before doing incentive  spirometry. It is harder to take a deep breath if you are having pain. AFTER USE  Rest and breathe slowly and easily.  It can be helpful to keep track of a log of your progress. Your caregiver can provide you with a simple table to help with this. If you are using the spirometer at home, follow these instructions: Malcolm IF:   You are having difficultly using the spirometer.  You have trouble using the spirometer as often as instructed.  Your pain medication is not giving enough relief while using the spirometer.  You develop fever of 100.5 F (38.1 C) or higher. SEEK IMMEDIATE MEDICAL CARE IF:   You cough up bloody sputum that had not been present before.  You develop fever of 102 F (38.9 C) or greater.  You develop worsening pain at or near the incision site. MAKE SURE YOU:   Understand these instructions.  Will watch your condition.  Will get help right away if you are not doing well or get worse. Document Released: 03/04/2007 Document Revised: 01/14/2012 Document Reviewed: 05/05/2007 [facility] Patient Information 2014 [place], [place].   ________________________________________________________________________

## 2017-07-16 NOTE — Progress Notes (Signed)
EKG 01-17-17 epic  CXR 01-17-17 epic  LOV cardiology Dr Molly Madurolevenger 03-07-16 [facility] epic

## 2017-07-17 ENCOUNTER — Encounter (HOSPITAL_COMMUNITY)
Admission: RE | Admit: 2017-07-17 | Discharge: 2017-07-17 | Disposition: A | Discharge status: Home / Self Care | Payer: Medicare HMO | Source: Ambulatory Visit | Attending: Surgery | Admitting: Surgery

## 2017-07-17 ENCOUNTER — Encounter (HOSPITAL_COMMUNITY): Payer: Self-pay | Admitting: Emergency Medicine

## 2017-07-17 DIAGNOSIS — Z01818 Encounter for other preprocedural examination: Secondary | ICD-10-CM | POA: Diagnosis not present

## 2017-07-17 DIAGNOSIS — H04123 Dry eye syndrome of bilateral lacrimal glands: Secondary | ICD-10-CM | POA: Diagnosis not present

## 2017-07-17 DIAGNOSIS — Z961 Presence of intraocular lens: Secondary | ICD-10-CM | POA: Diagnosis not present

## 2017-07-17 DIAGNOSIS — H40013 Open angle with borderline findings, low risk, bilateral: Secondary | ICD-10-CM | POA: Diagnosis not present

## 2017-07-17 HISTORY — DX: Pneumonia, unspecified organism: J18.9

## 2017-07-17 HISTORY — DX: Other intervertebral disc degeneration, lumbar region: M51.36

## 2017-07-17 HISTORY — DX: Synovial cyst of popliteal space (Baker), unspecified knee: M71.20

## 2017-07-17 HISTORY — DX: Other intervertebral disc degeneration, lumbar region without mention of lumbar back pain or lower extremity pain: M51.369

## 2017-07-17 HISTORY — DX: Other specified postprocedural states: Z98.890

## 2017-07-17 HISTORY — DX: Phlebitis and thrombophlebitis of unspecified site: I80.9

## 2017-07-17 HISTORY — DX: Other specified postprocedural states: R11.2

## 2017-07-17 HISTORY — DX: Other intervertebral disc displacement, lumbar region: M51.26

## 2017-07-17 LAB — COMPREHENSIVE METABOLIC PANEL
ALBUMIN: 4.1 g/dL (ref 3.5–5.0)
ALK PHOS: 38 U/L (ref 38–126)
ALT: 19 U/L (ref 14–54)
AST: 23 U/L (ref 15–41)
Anion gap: 14 (ref 5–15)
BILIRUBIN TOTAL: 0.7 mg/dL (ref 0.3–1.2)
BUN: 122 mg/dL — AB (ref 6–20)
CALCIUM: 10.5 mg/dL — AB (ref 8.9–10.3)
CO2: 18 mmol/L — ABNORMAL LOW (ref 22–32)
CREATININE: 1.93 mg/dL — AB (ref 0.44–1.00)
Chloride: 105 mmol/L (ref 101–111)
GFR calc Af Amer: 30 mL/min — ABNORMAL LOW (ref >60)
GFR, EST NON AFRICAN AMERICAN: 26 mL/min — AB (ref >60)
GLUCOSE: 110 mg/dL — AB (ref 65–99)
Potassium: 5 mmol/L (ref 3.5–5.1)
Sodium: 137 mmol/L (ref 135–145)
TOTAL PROTEIN: 7.9 g/dL (ref 6.5–8.1)

## 2017-07-17 LAB — CBC WITH DIFFERENTIAL/PLATELET
BASOS ABS: 0.1 10*3/uL (ref 0.0–0.1)
BASOS PCT: 0 %
Eosinophils Absolute: 0.2 10*3/uL (ref 0.0–0.7)
Eosinophils Relative: 2 %
HEMATOCRIT: 39.5 % (ref 36.0–46.0)
HEMOGLOBIN: 13.4 g/dL (ref 12.0–15.0)
LYMPHS PCT: 18 %
Lymphs Abs: 2.3 10*3/uL (ref 0.7–4.0)
MCH: 32.1 pg (ref 26.0–34.0)
MCHC: 33.9 g/dL (ref 30.0–36.0)
MCV: 94.7 fL (ref 78.0–100.0)
Monocytes Absolute: 0.5 10*3/uL (ref 0.1–1.0)
Monocytes Relative: 4 %
NEUTROS ABS: 9.6 10*3/uL — AB (ref 1.7–7.7)
NEUTROS PCT: 76 %
Platelets: 292 10*3/uL (ref 150–400)
RBC: 4.17 MIL/uL (ref 3.87–5.11)
RDW: 14.9 % (ref 11.5–15.5)
WBC: 12.6 10*3/uL — ABNORMAL HIGH (ref 4.0–10.5)

## 2017-07-18 ENCOUNTER — Other Ambulatory Visit (HOSPITAL_COMMUNITY): Payer: Self-pay

## 2017-07-18 NOTE — Progress Notes (Signed)
CMP and CBCdiff routed via epic to Dr Luretha MurphyMatthew Martin
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
## 2017-07-22 NOTE — Research (Deleted)
On  07-17-17  And 07-18-17 ,RN requested from Dr Molly Maduro office with [facility] patient records of ECHO, Stress Test and past EKGs. RN also And called nursing line and LVMM today again requesting records . No response back.    See office visits with Dr Abelardo Diesel on  08-03-14 and 08-30-14 addressing ECHO and stress test results.

## 2017-07-22 NOTE — Progress Notes (Signed)
Stress Tests result 08-06-2014 on chart from [facility] cardiology   EKG 03-07-16 on chart from [facility]

## 2017-07-22 NOTE — Progress Notes (Signed)
Called patient to notify her of time change for 07/23/17 surgery. Patient to arrive 0800 for 1000-1200 surgery. She verbalizes understanding. NPO after midnight. Reminded her of showers tonight and in the AM with CHG.

## 2017-07-23 ENCOUNTER — Encounter (HOSPITAL_COMMUNITY): Payer: Self-pay | Admitting: *Deleted

## 2017-07-23 ENCOUNTER — Inpatient Hospital Stay (HOSPITAL_COMMUNITY): Payer: Medicare HMO

## 2017-07-23 ENCOUNTER — Encounter (HOSPITAL_COMMUNITY)
Admission: RE | Disposition: A | Discharge status: Home / Self Care | Payer: Self-pay | Source: Ambulatory Visit | Attending: Surgery

## 2017-07-23 ENCOUNTER — Inpatient Hospital Stay (HOSPITAL_COMMUNITY)
Admission: RE | Admit: 2017-07-23 | Discharge: 2017-07-25 | DRG: 621 | Disposition: A | Discharge status: Home / Self Care | Payer: Medicare HMO | Source: Ambulatory Visit | Attending: Surgery | Admitting: Surgery

## 2017-07-23 DIAGNOSIS — Z96653 Presence of artificial knee joint, bilateral: Secondary | ICD-10-CM | POA: Diagnosis present

## 2017-07-23 DIAGNOSIS — Z79899 Other long term (current) drug therapy: Secondary | ICD-10-CM | POA: Diagnosis not present

## 2017-07-23 DIAGNOSIS — K449 Diaphragmatic hernia without obstruction or gangrene: Secondary | ICD-10-CM | POA: Diagnosis present

## 2017-07-23 DIAGNOSIS — Z9884 Bariatric surgery status: Secondary | ICD-10-CM

## 2017-07-23 DIAGNOSIS — I10 Essential (primary) hypertension: Secondary | ICD-10-CM | POA: Diagnosis not present

## 2017-07-23 DIAGNOSIS — Z973 Presence of spectacles and contact lenses: Secondary | ICD-10-CM

## 2017-07-23 DIAGNOSIS — Z6841 Body Mass Index (BMI) 40.0 and over, adult: Secondary | ICD-10-CM | POA: Diagnosis not present

## 2017-07-23 DIAGNOSIS — F329 Major depressive disorder, single episode, unspecified: Secondary | ICD-10-CM | POA: Diagnosis present

## 2017-07-23 DIAGNOSIS — Z888 Allergy status to other drugs, medicaments and biological substances status: Secondary | ICD-10-CM | POA: Diagnosis not present

## 2017-07-23 DIAGNOSIS — Z8249 Family history of ischemic heart disease and other diseases of the circulatory system: Secondary | ICD-10-CM | POA: Diagnosis not present

## 2017-07-23 DIAGNOSIS — Z881 Allergy status to other antibiotic agents status: Secondary | ICD-10-CM

## 2017-07-23 DIAGNOSIS — K219 Gastro-esophageal reflux disease without esophagitis: Secondary | ICD-10-CM | POA: Diagnosis not present

## 2017-07-23 DIAGNOSIS — Z90710 Acquired absence of both cervix and uterus: Secondary | ICD-10-CM | POA: Diagnosis not present

## 2017-07-23 DIAGNOSIS — Z7982 Long term (current) use of aspirin: Secondary | ICD-10-CM

## 2017-07-23 DIAGNOSIS — Z9049 Acquired absence of other specified parts of digestive tract: Secondary | ICD-10-CM | POA: Diagnosis not present

## 2017-07-23 HISTORY — PX: LAPAROSCOPIC GASTRIC SLEEVE RESECTION WITH HIATAL HERNIA REPAIR: SHX6512

## 2017-07-23 LAB — GLUCOSE, CAPILLARY: Glucose-Capillary: 164 mg/dL — ABNORMAL HIGH (ref 65–99)

## 2017-07-23 LAB — CBC
HCT: 37.1 % (ref 36.0–46.0)
HEMOGLOBIN: 12.4 g/dL (ref 12.0–15.0)
MCH: 32.2 pg (ref 26.0–34.0)
MCHC: 33.4 g/dL (ref 30.0–36.0)
MCV: 96.4 fL (ref 78.0–100.0)
Platelets: 243 10*3/uL (ref 150–400)
RBC: 3.85 MIL/uL — ABNORMAL LOW (ref 3.87–5.11)
RDW: 15.6 % — ABNORMAL HIGH (ref 11.5–15.5)
WBC: 17.5 10*3/uL — ABNORMAL HIGH (ref 4.0–10.5)

## 2017-07-23 LAB — CREATININE, SERUM
CREATININE: 2.23 mg/dL — AB (ref 0.44–1.00)
GFR calc Af Amer: 25 mL/min — ABNORMAL LOW (ref >60)
GFR, EST NON AFRICAN AMERICAN: 22 mL/min — AB (ref >60)

## 2017-07-23 SURGERY — GASTRECTOMY, SLEEVE, LAPAROSCOPIC, WITH HIATAL HERNIA REPAIR
Anesthesia: General | Site: Abdomen

## 2017-07-23 MED ORDER — 0.9 % SODIUM CHLORIDE (POUR BTL) OPTIME
TOPICAL | Status: DC | PRN
  Administered 2017-07-23: 1000 mL

## 2017-07-23 MED ORDER — LIDOCAINE 2% (20 MG/ML) 5 ML SYRINGE
INTRAMUSCULAR | Status: DC | PRN
Start: 2017-07-23 — End: 2017-07-23
  Administered 2017-07-23: 100 mg via INTRAVENOUS

## 2017-07-23 MED ORDER — BENAZEPRIL HCL 10 MG PO TABS
10.0000 mg | ORAL_TABLET | Freq: Every day | ORAL | Status: DC
  Administered 2017-07-24 – 2017-07-25 (×2): 10 mg via ORAL
  Filled 2017-07-23 (×2): qty 1

## 2017-07-23 MED ORDER — OXYCODONE HCL 5 MG/5ML PO SOLN
5.0000 mg | Freq: Once | ORAL | Status: DC | PRN
  Filled 2017-07-23: qty 5

## 2017-07-23 MED ORDER — MORPHINE SULFATE (PF) 2 MG/ML IV SOLN
1.0000 mg | INTRAVENOUS | Status: DC | PRN
  Administered 2017-07-23: 2 mg via INTRAVENOUS
  Administered 2017-07-23 – 2017-07-24 (×2): 3 mg via INTRAVENOUS
  Filled 2017-07-23: qty 1
  Filled 2017-07-23 (×2): qty 2

## 2017-07-23 MED ORDER — KETAMINE HCL 10 MG/ML IJ SOLN
INTRAMUSCULAR | Status: AC
  Filled 2017-07-23: qty 1

## 2017-07-23 MED ORDER — ONDANSETRON HCL 4 MG/2ML IJ SOLN
INTRAMUSCULAR | Status: DC | PRN
  Administered 2017-07-23: 4 mg via INTRAVENOUS

## 2017-07-23 MED ORDER — ACETAMINOPHEN 160 MG/5ML PO SOLN
325.0000 mg | ORAL | Status: DC | PRN
  Administered 2017-07-24: 650 mg via ORAL
  Filled 2017-07-23: qty 20.3

## 2017-07-23 MED ORDER — FUROSEMIDE 20 MG PO TABS
20.0000 mg | ORAL_TABLET | Freq: Every day | ORAL | Status: DC
  Administered 2017-07-24 – 2017-07-25 (×2): 20 mg via ORAL
  Filled 2017-07-23 (×2): qty 1

## 2017-07-23 MED ORDER — MIDAZOLAM HCL 2 MG/2ML IJ SOLN
INTRAMUSCULAR | Status: AC
  Filled 2017-07-23: qty 2

## 2017-07-23 MED ORDER — PROMETHAZINE HCL 25 MG/ML IJ SOLN
INTRAMUSCULAR | Status: AC
  Filled 2017-07-23: qty 1

## 2017-07-23 MED ORDER — SPIRONOLACTONE 25 MG PO TABS
50.0000 mg | ORAL_TABLET | Freq: Every day | ORAL | Status: DC
  Administered 2017-07-24 – 2017-07-25 (×2): 50 mg via ORAL
  Filled 2017-07-23 (×2): qty 2

## 2017-07-23 MED ORDER — MIDAZOLAM HCL 5 MG/5ML IJ SOLN
INTRAMUSCULAR | Status: DC | PRN
  Administered 2017-07-23 (×2): 1 mg via INTRAVENOUS

## 2017-07-23 MED ORDER — SUGAMMADEX SODIUM 500 MG/5ML IV SOLN
INTRAVENOUS | Status: AC
  Filled 2017-07-23: qty 5

## 2017-07-23 MED ORDER — BUPIVACAINE LIPOSOME 1.3 % IJ SUSP
20.0000 mL | Freq: Once | INTRAMUSCULAR | Status: AC
  Administered 2017-07-23: 20 mL
  Filled 2017-07-23: qty 20

## 2017-07-23 MED ORDER — EPHEDRINE SULFATE 50 MG/ML IJ SOLN
INTRAMUSCULAR | Status: DC | PRN
  Administered 2017-07-23 (×2): 10 mg via INTRAVENOUS

## 2017-07-23 MED ORDER — LACTATED RINGERS IV SOLN
INTRAVENOUS | Status: DC
  Administered 2017-07-23: 08:00:00 via INTRAVENOUS
  Administered 2017-07-23: 1000 mL via INTRAVENOUS
  Administered 2017-07-23: 11:00:00 via INTRAVENOUS

## 2017-07-23 MED ORDER — KCL IN DEXTROSE-NACL 20-5-0.45 MEQ/L-%-% IV SOLN
INTRAVENOUS | Status: DC
  Administered 2017-07-23 – 2017-07-25 (×4): via INTRAVENOUS
  Filled 2017-07-23 (×4): qty 1000

## 2017-07-23 MED ORDER — CHLORHEXIDINE GLUCONATE CLOTH 2 % EX PADS: 6.0000 | MEDICATED_PAD | Freq: Once | CUTANEOUS | Status: DC

## 2017-07-23 MED ORDER — PHENYLEPHRINE HCL 10 MG/ML IJ SOLN
INTRAMUSCULAR | Status: DC | PRN
  Administered 2017-07-23 (×3): 80 ug via INTRAVENOUS
  Administered 2017-07-23: 120 ug via INTRAVENOUS
  Administered 2017-07-23 (×2): 80 ug via INTRAVENOUS

## 2017-07-23 MED ORDER — PREMIER PROTEIN SHAKE
2.0000 [oz_av] | ORAL | Status: DC
  Administered 2017-07-24 – 2017-07-25 (×7): 2 [oz_av] via ORAL

## 2017-07-23 MED ORDER — KETAMINE HCL 10 MG/ML IJ SOLN
INTRAMUSCULAR | Status: DC | PRN
  Administered 2017-07-23: 20 mg via INTRAVENOUS
  Administered 2017-07-23: 30 mg via INTRAVENOUS

## 2017-07-23 MED ORDER — FENTANYL CITRATE (PF) 100 MCG/2ML IJ SOLN
INTRAMUSCULAR | Status: DC | PRN
  Administered 2017-07-23 (×2): 50 ug via INTRAVENOUS

## 2017-07-23 MED ORDER — HYDROMORPHONE HCL-NACL 0.5-0.9 MG/ML-% IV SOSY
0.2500 mg | PREFILLED_SYRINGE | INTRAVENOUS | Status: DC | PRN
  Administered 2017-07-23 (×3): 0.5 mg via INTRAVENOUS

## 2017-07-23 MED ORDER — HYDROMORPHONE HCL-NACL 0.5-0.9 MG/ML-% IV SOSY
PREFILLED_SYRINGE | INTRAVENOUS | Status: AC
  Filled 2017-07-23: qty 1

## 2017-07-23 MED ORDER — SUGAMMADEX SODIUM 500 MG/5ML IV SOLN
INTRAVENOUS | Status: DC | PRN
  Administered 2017-07-23: 300 mg via INTRAVENOUS

## 2017-07-23 MED ORDER — CEFOTETAN DISODIUM-DEXTROSE 2-2.08 GM-% IV SOLR
2.0000 g | INTRAVENOUS | Status: AC
  Administered 2017-07-23: 2 g via INTRAVENOUS
  Filled 2017-07-23: qty 50

## 2017-07-23 MED ORDER — FLUTICASONE FUROATE-VILANTEROL 100-25 MCG/INH IN AEPB
1.0000 | INHALATION_SPRAY | Freq: Every day | RESPIRATORY_TRACT | Status: DC
  Administered 2017-07-24 – 2017-07-25 (×2): 1 via RESPIRATORY_TRACT
  Filled 2017-07-23: qty 28

## 2017-07-23 MED ORDER — OXYCODONE HCL 5 MG PO TABS: 5.0000 mg | ORAL_TABLET | Freq: Once | ORAL | Status: DC | PRN

## 2017-07-23 MED ORDER — FENTANYL CITRATE (PF) 250 MCG/5ML IJ SOLN
INTRAMUSCULAR | Status: AC
  Filled 2017-07-23: qty 5

## 2017-07-23 MED ORDER — OXYCODONE HCL 5 MG/5ML PO SOLN
5.0000 mg | ORAL | Status: DC | PRN
  Administered 2017-07-24 – 2017-07-25 (×4): 5 mg via ORAL
  Filled 2017-07-23 (×2): qty 5
  Filled 2017-07-23 (×2): qty 10
  Filled 2017-07-23: qty 5

## 2017-07-23 MED ORDER — MEPERIDINE HCL 50 MG/ML IJ SOLN: 6.2500 mg | INTRAMUSCULAR | Status: DC | PRN

## 2017-07-23 MED ORDER — ONDANSETRON HCL 4 MG/2ML IJ SOLN
4.0000 mg | INTRAMUSCULAR | Status: DC | PRN
  Administered 2017-07-23 – 2017-07-25 (×7): 4 mg via INTRAVENOUS
  Filled 2017-07-23 (×7): qty 2

## 2017-07-23 MED ORDER — LIDOCAINE 2% (20 MG/ML) 5 ML SYRINGE
INTRAMUSCULAR | Status: DC | PRN
  Administered 2017-07-23 (×2): 1.5 mg/kg/h via INTRAVENOUS

## 2017-07-23 MED ORDER — SODIUM CHLORIDE 0.9 % IJ SOLN
INTRAMUSCULAR | Status: AC
  Filled 2017-07-23: qty 10

## 2017-07-23 MED ORDER — SODIUM CHLORIDE 0.9 % IJ SOLN
INTRAMUSCULAR | Status: AC
  Filled 2017-07-23: qty 20

## 2017-07-23 MED ORDER — HEPARIN SODIUM (PORCINE) 5000 UNIT/ML IJ SOLN
5000.0000 [IU] | Freq: Three times a day (TID) | INTRAMUSCULAR | Status: DC
  Administered 2017-07-23 – 2017-07-25 (×5): 5000 [IU] via SUBCUTANEOUS
  Filled 2017-07-23 (×5): qty 1

## 2017-07-23 MED ORDER — HEPARIN SODIUM (PORCINE) 5000 UNIT/ML IJ SOLN
5000.0000 [IU] | INTRAMUSCULAR | Status: AC
  Administered 2017-07-23: 5000 [IU] via SUBCUTANEOUS
  Filled 2017-07-23: qty 1

## 2017-07-23 MED ORDER — ALBUTEROL SULFATE (2.5 MG/3ML) 0.083% IN NEBU: 2.5000 mg | INHALATION_SOLUTION | Freq: Four times a day (QID) | RESPIRATORY_TRACT | Status: DC | PRN

## 2017-07-23 MED ORDER — PROPOFOL 10 MG/ML IV BOLUS
INTRAVENOUS | Status: DC | PRN
Start: 2017-07-23 — End: 2017-07-23
  Administered 2017-07-23: 150 mg via INTRAVENOUS
  Administered 2017-07-23: 30 mg via INTRAVENOUS

## 2017-07-23 MED ORDER — ROCURONIUM BROMIDE 10 MG/ML (PF) SYRINGE
PREFILLED_SYRINGE | INTRAVENOUS | Status: DC | PRN
Start: 2017-07-23 — End: 2017-07-23
  Administered 2017-07-23: 20 mg via INTRAVENOUS
  Administered 2017-07-23: 50 mg via INTRAVENOUS

## 2017-07-23 MED ORDER — SUCCINYLCHOLINE CHLORIDE 200 MG/10ML IV SOSY
PREFILLED_SYRINGE | INTRAVENOUS | Status: DC | PRN
Start: 2017-07-23 — End: 2017-07-23
  Administered 2017-07-23: 120 mg via INTRAVENOUS

## 2017-07-23 MED ORDER — LEVOTHYROXINE SODIUM 100 MCG PO TABS
100.0000 ug | ORAL_TABLET | Freq: Every day | ORAL | Status: DC
  Administered 2017-07-24 – 2017-07-25 (×2): 100 ug via ORAL
  Filled 2017-07-23 (×2): qty 1

## 2017-07-23 MED ORDER — HYDROMORPHONE HCL-NACL 0.5-0.9 MG/ML-% IV SOSY
PREFILLED_SYRINGE | INTRAVENOUS | Status: AC
  Filled 2017-07-23: qty 2

## 2017-07-23 MED ORDER — APREPITANT 40 MG PO CAPS
40.0000 mg | ORAL_CAPSULE | ORAL | Status: DC
  Filled 2017-07-23: qty 1

## 2017-07-23 MED ORDER — HYDRALAZINE HCL 20 MG/ML IJ SOLN: 10.0000 mg | INTRAMUSCULAR | Status: DC | PRN

## 2017-07-23 MED ORDER — ACETAMINOPHEN 325 MG PO TABS: 650.0000 mg | ORAL_TABLET | ORAL | Status: DC | PRN

## 2017-07-23 MED ORDER — PANTOPRAZOLE SODIUM 40 MG IV SOLR
40.0000 mg | Freq: Every day | INTRAVENOUS | Status: DC
  Administered 2017-07-23 – 2017-07-24 (×2): 40 mg via INTRAVENOUS
  Filled 2017-07-23 (×2): qty 40

## 2017-07-23 MED ORDER — LACTATED RINGERS IR SOLN
Status: DC | PRN
  Administered 2017-07-23: 1000 mL

## 2017-07-23 MED ORDER — DEXAMETHASONE SODIUM PHOSPHATE 10 MG/ML IJ SOLN
INTRAMUSCULAR | Status: DC | PRN
  Administered 2017-07-23: 10 mg via INTRAVENOUS

## 2017-07-23 MED ORDER — PROMETHAZINE HCL 25 MG/ML IJ SOLN
6.2500 mg | INTRAMUSCULAR | Status: AC | PRN
  Administered 2017-07-23 (×2): 6.25 mg via INTRAVENOUS

## 2017-07-23 MED ORDER — SODIUM CHLORIDE 0.9 % IJ SOLN
INTRAMUSCULAR | Status: DC | PRN
  Administered 2017-07-23: 10 mL

## 2017-07-23 SURGICAL SUPPLY — 59 items
APPLICATOR COTTON TIP 6IN STRL (MISCELLANEOUS) IMPLANT
APPLIER CLIP 5 13 M/L LIGAMAX5 (MISCELLANEOUS)
APPLIER CLIP ROT 10 11.4 M/L (STAPLE)
APPLIER CLIP ROT 13.4 12 LRG (CLIP)
BLADE SURG 15 STRL LF DISP TIS (BLADE) ×2 IMPLANT
BLADE SURG 15 STRL SS (BLADE) ×2
CABLE HIGH FREQUENCY MONO STRZ (ELECTRODE) ×4 IMPLANT
CLIP APPLIE 5 13 M/L LIGAMAX5 (MISCELLANEOUS) IMPLANT
CLIP APPLIE ROT 10 11.4 M/L (STAPLE) IMPLANT
CLIP APPLIE ROT 13.4 12 LRG (CLIP) IMPLANT
DERMABOND ADVANCED (GAUZE/BANDAGES/DRESSINGS) ×2
DERMABOND ADVANCED .7 DNX12 (GAUZE/BANDAGES/DRESSINGS) ×2 IMPLANT
DEVICE SUT QUICK LOAD TK 5 (STAPLE) ×3 IMPLANT
DEVICE SUT TI-KNOT TK 5X26 (MISCELLANEOUS) ×3 IMPLANT
DEVICE SUTURE ENDOST 10MM (ENDOMECHANICALS) ×4 IMPLANT
DEVICE TI KNOT TK5 (MISCELLANEOUS) ×1
DEVICE TROCAR PUNCTURE CLOSURE (ENDOMECHANICALS) ×4 IMPLANT
DISSECTOR BLUNT TIP ENDO 5MM (MISCELLANEOUS) ×4 IMPLANT
ELECT REM PT RETURN 15FT ADLT (MISCELLANEOUS) ×4 IMPLANT
GAUZE SPONGE 4X4 12PLY STRL (GAUZE/BANDAGES/DRESSINGS) IMPLANT
GLOVE BIOGEL M 8.0 STRL (GLOVE) ×4 IMPLANT
GOWN STRL REUS W/TWL XL LVL3 (GOWN DISPOSABLE) ×16 IMPLANT
HANDLE STAPLE EGIA 4 XL (STAPLE) ×4 IMPLANT
HOVERMATT SINGLE USE (MISCELLANEOUS) ×4 IMPLANT
KIT BASIN OR (CUSTOM PROCEDURE TRAY) ×4 IMPLANT
MARKER SKIN DUAL TIP RULER LAB (MISCELLANEOUS) ×4 IMPLANT
NEEDLE SPNL 22GX3.5 QUINCKE BK (NEEDLE) ×4 IMPLANT
PACK UNIVERSAL I (CUSTOM PROCEDURE TRAY) ×4 IMPLANT
QUICK LOAD TK 5 (STAPLE) ×1
RELOAD TRI 45 ART MED THCK BLK (STAPLE) ×4 IMPLANT
RELOAD TRI 45 ART MED THCK PUR (STAPLE) ×4 IMPLANT
RELOAD TRI 60 ART MED THCK BLK (STAPLE) ×4 IMPLANT
RELOAD TRI 60 ART MED THCK PUR (STAPLE) ×12 IMPLANT
SCISSORS LAP 5X45 EPIX DISP (ENDOMECHANICALS) IMPLANT
SET IRRIG TUBING LAPAROSCOPIC (IRRIGATION / IRRIGATOR) ×4 IMPLANT
SHEARS HARMONIC ACE PLUS 45CM (MISCELLANEOUS) ×4 IMPLANT
SLEEVE ADV FIXATION 5X100MM (TROCAR) ×8 IMPLANT
SLEEVE GASTRECTOMY 36FR VISIGI (MISCELLANEOUS) ×4 IMPLANT
SOLUTION ANTI FOG 6CC (MISCELLANEOUS) ×4 IMPLANT
SPONGE LAP 18X18 X RAY DECT (DISPOSABLE) ×4 IMPLANT
STAPLER VISISTAT 35W (STAPLE) ×4 IMPLANT
SUT MNCRL AB 4-0 PS2 18 (SUTURE) ×8 IMPLANT
SUT SURGIDAC NAB ES-9 0 48 120 (SUTURE) ×4 IMPLANT
SUT VIC AB 4-0 SH 18 (SUTURE) ×4 IMPLANT
SUT VICRYL 0 TIES 12 18 (SUTURE) ×4 IMPLANT
SYR 10ML ECCENTRIC (SYRINGE) ×4 IMPLANT
SYR 20CC LL (SYRINGE) ×4 IMPLANT
SYR 50ML LL SCALE MARK (SYRINGE) ×4 IMPLANT
TOWEL OR 17X26 10 PK STRL BLUE (TOWEL DISPOSABLE) ×8 IMPLANT
TOWEL OR NON WOVEN STRL DISP B (DISPOSABLE) ×4 IMPLANT
TRAY FOLEY W/METER SILVER 16FR (SET/KITS/TRAYS/PACK) IMPLANT
TROCAR ADV FIXATION 5X100MM (TROCAR) ×4 IMPLANT
TROCAR BLADELESS 15MM (ENDOMECHANICALS) ×4 IMPLANT
TROCAR BLADELESS OPT 5 100 (ENDOMECHANICALS) ×4 IMPLANT
TUBE CALIBRATION LAPBAND (TUBING) ×4 IMPLANT
TUBING CONNECTING 10 (TUBING) ×6 IMPLANT
TUBING CONNECTING 10' (TUBING) ×2
TUBING ENDO SMARTCAP (MISCELLANEOUS) ×4 IMPLANT
TUBING INSUF HEATED (TUBING) ×4 IMPLANT

## 2017-07-23 NOTE — Anesthesia Procedure Notes (Signed)
Procedure Name: Intubation Date/Time: 07/23/2017 10:14 AM Performed by: Florene Route Pre-anesthesia Checklist: Patient identified, Emergency Drugs available, Suction available and Patient being monitored Patient Re-evaluated:Patient Re-evaluated prior to induction Oxygen Delivery Method: Circle system utilized Preoxygenation: Pre-oxygenation with 100% oxygen Induction Type: IV induction Ventilation: Mask ventilation without difficulty and Oral airway inserted - appropriate to patient size Laryngoscope Size: Miller and 2 Grade View: Grade II Tube type: Oral Tube size: 7.5 mm Number of attempts: 1 Airway Equipment and Method: Stylet and Oral airway Placement Confirmation: ETT inserted through vocal cords under direct vision,  positive ETCO2 and breath sounds checked- equal and bilateral Tube secured with: Tape Dental Injury: Teeth and Oropharynx as per pre-operative assessment

## 2017-07-23 NOTE — Interval H&P Note (Signed)
History and Physical Interval Note:  07/23/2017 9:48 AM  Annette Smith  has presented today for surgery, with the diagnosis of MORBID OBESITY  The various methods of treatment have been discussed with the patient and family. After consideration of risks, benefits and other options for treatment, the patient has consented to  Procedure(s): LAPAROSCOPIC GASTRIC SLEEVE RESECTION WITH UPPER ENDO (N/A) as a surgical intervention .  The patient's history has been reviewed, patient examined, no change in status, stable for surgery.  I have reviewed the patient's chart and labs.  Questions were answered to the patient's satisfaction.     [name] B   

## 2017-07-23 NOTE — Anesthesia Postprocedure Evaluation (Signed)
Anesthesia Post Note  Patient: Annette Smith  Procedure(s) Performed: Procedure(s) (LRB): LAPAROSCOPIC GASTRIC SLEEVE RESECTION WITH HIATAL HERNIA REPAIR AND UPPER ENDOSCOPY (N/A)     Patient location during evaluation: PACU Anesthesia Type: General Level of consciousness: awake and alert Pain management: pain level controlled Vital Signs Assessment: post-procedure vital signs reviewed and stable Respiratory status: spontaneous breathing, nonlabored ventilation and respiratory function stable Cardiovascular status: blood pressure returned to baseline and stable Postop Assessment: no apparent nausea or vomiting Anesthetic complications: no    Last Vitals:  Vitals:   07/23/17 1315 07/23/17 1319  BP: (!) 102/51   Pulse: 88 89  Resp: 18 (!) 23  Temp: (!) 36.3 C   SpO2: 100% 100%    Last Pain:  Vitals:   07/23/17 1300  TempSrc:   PainSc: Asleep                 Lowella Curb

## 2017-07-23 NOTE — Discharge Instructions (Signed)
° ° ° °GASTRIC BYPASS/SLEEVE ° Home Care Instructions ° ° These instructions are to help you care for yourself when you go home. ° °Call: If you have any problems. °• Call [phone] and ask for the surgeon on call °• If you need immediate assistance come to the ER at [place]. Tell the ER staff you are a new post-op gastric bypass or gastric sleeve patient  °Signs and symptoms to report: • Severe  vomiting or nausea °o If you cannot handle clear liquids for longer than 1 day, call your surgeon °• Abdominal pain which does not get better after taking your pain medication °• Fever greater than 100.4°  F and chills °• Heart rate over 100 beats a minute °• Trouble breathing °• Chest pain °• Redness,  swelling, drainage, or foul odor at incision (surgical) sites °• If your incisions open or pull apart °• Swelling or pain in calf (lower leg) °• Diarrhea (Loose bowel movements that happen often), frequent watery, uncontrolled bowel movements °• Constipation, (no bowel movements for 3 days) if this happens: °o Take Milk of Magnesia, 2 tablespoons by mouth, 3 times a day for 2 days if needed °o Stop taking Milk of Magnesia once you have had a bowel movement °o Call your doctor if constipation continues °Or °o Take Miralax  (instead of Milk of Magnesia) following the label instructions °o Stop taking Miralax once you have had a bowel movement °o Call your doctor if constipation continues °• Anything you think is “abnormal for you” °  °Normal side effects after surgery: • Unable to sleep at night or unable to concentrate °• Irritability °• Being tearful (crying) or depressed ° °These are common complaints, possibly related to your anesthesia, stress of surgery, and change in lifestyle, that usually go away a few weeks after surgery. If these feelings continue, call your medical doctor.  °Wound Care: You may have surgical glue, steri-strips, or staples over your incisions after surgery °• Surgical glue: Looks like clear  film over your incisions and will wear off a little at a time °• Steri-strips: Adhesive strips of tape over your incisions. You may notice a yellowish color on skin under the steri-strips. This is used to make the steri-strips stick better. Do not pull the steri-strips off - let them fall off °• Staples: Staples may be removed before you leave the hospital °o If you go home with staples, call [facility] for an appointment with your surgeon’s nurse to have staples removed 10 days after surgery, [phone] °• Showering: You may shower two (2) days after your surgery unless your surgeon tells you differently °o Wash gently around incisions with warm soapy water, rinse well, and gently pat dry °o If you have a drain (tube from your incision), you may need someone to hold this while you shower °o No tub baths until staples are removed and incisions are healed °  °Medications: • Medications should be liquid or crushed if larger than the size of a dime °• Extended release pills (medication that releases a little bit at a time through the  day) should not be crushed °• Depending on the size and number of medications you take, you may need to space (take a few throughout the day)/change the time you take your medications so that you do not over-fill your pouch (smaller stomach) °• Make sure you follow-up with you primary care physician to make medication changes needed during rapid weight loss and life -style changes °•   If you have diabetes, follow up with your doctor that orders your diabetes medication(s) within one week after surgery and check your blood sugar regularly ° °• Do not drive while taking narcotics (pain medications) ° °• Do not take acetaminophen (Tylenol) and Roxicet or Lortab Elixir at the same time since these pain medications contain acetaminophen °  °Diet:  °First 2 Weeks You will see the nutritionist about two (2) weeks after your surgery. The nutritionist will increase the types of  foods you can eat if you are handling liquids well: °• If you have severe vomiting or nausea and cannot handle clear liquids lasting longer than 1 day call your surgeon °Protein Shake °• Drink at least 2 ounces of shake 5-6 times per day °• Each serving of protein shakes (usually 8-12 ounces) should have a minimum of: °o 15 grams of protein °o And no more than 5 grams of carbohydrate °• Goal for protein each day: °o Men = 80 grams per day °o Women = 60 grams per day °  ° • Protein powder may be added to fluids such as non-fat milk or Lactaid milk or Soy milk (limit to 35 grams added protein powder per serving) ° °Hydration °• Slowly increase the amount of water and other clear liquids as tolerated (See Acceptable Fluids) °• Slowly increase the amount of protein shake as tolerated °• Sip fluids slowly and throughout the day °• May use sugar substitutes in small amounts (no more than 6-8 packets per day; i.e. Splenda) ° °Fluid Goal °• The first goal is to drink at least 8 ounces of protein shake/drink per day (or as directed by the nutritionist); some examples of protein shakes are Syntrax Nectar, Adkins Advantage, EAS Edge HP, and Unjury. - See handout from pre-op Bariatric Education Class: °o Slowly increase the amount of protein shake you drink as tolerated °o You may find it easier to slowly sip shakes throughout the day °o It is important to get your proteins in first °• Your fluid goal is to drink 64-100 ounces of fluid daily °o It may take a few weeks to build up to this  °• 32 oz. (or more) should be clear liquids °And °• 32 oz. (or more) should be full liquids (see below for examples) °• Liquids should not contain sugar, caffeine, or carbonation ° °Clear Liquids: °• Water of Sugar-free flavored water (i.e. Fruit H²O, Propel) °• Decaffeinated coffee or tea (sugar-free) °• Crystal lite, Wyler’s Lite, Minute Maid Lite °• Sugar-free Jell-O °• Bouillon or broth °• Sugar-free Popsicle:    - Less than 20 calories  each; Limit 1 per day ° °Full Liquids: °                  Protein Shakes/Drinks + 2 choices per day of other full liquids °• Full liquids must be: °o No More Than 12 grams of Carbs per serving °o No More Than 3 grams of Fat per serving °• Strained low-fat cream soup °• Non-Fat milk °• Fat-free Lactaid Milk °• Sugar-free yogurt (Dannon Lite & Fit, Greek yogurt) ° °  °Vitamins and Minerals • Start 1 day after surgery unless otherwise directed by your surgeon °• 2 Chewable Bariatric Multivitamin / Multimineral Supplement with iron °• Chewable Calcium Citrate with Vitamin D-3 °(Example: 3 Chewable Calcium  Plus 600 with Vitamin D-3) °o Take 500 mg three (3) times a day for a total of 1500 mg each day °o Do not take all 3 doses of calcium   at one time as it may cause constipation, and you can only absorb 500 mg at a time °o Do not mix multivitamins containing iron with calcium supplements;  take 2 hours apart °• Menstruating women and those at risk for anemia ( a blood disease that causes weakness) may need extra iron °o Talk to your doctor to see if you need more iron °• If you need extra iron: Total daily Iron recommendation (including Vitamins) is 50 to 100 mg Iron/day °• Do not stop taking or change any vitamins or minerals until you talk to your nutritionist or surgeon °• Your nutritionist and/or surgeon must approve all vitamin and mineral supplements °  °Activity and Exercise: It is important to continue walking at home. Limit your physical activity as instructed by your doctor. During this time, use these guidelines: °• Do not lift anything greater than ten  (10) pounds for at least two (2) weeks °• Do not go back to work or drive until your surgeon says you can °• You may have sex when you feel comfortable °o It is VERY important for female patients to use a reliable birth control method; fertility often increase after surgery °o Do not get pregnant for at least 18 months °• Start exercising as soon as your  doctor tells you that you can °o Make sure your doctor approves any physical activity °• Start with a simple walking program °• Walk 5-15 minutes each day, 7 days per week °• Slowly increase until you are walking 30-45 minutes per day °• Consider joining our BELT program. [phone] or email [email or web address] °  °Special Instructions Things to remember: °• Use your CPAP when sleeping if this applies to you °• Consider buying a medical alert bracelet that says you had lap-band surgery °  °  You will likely have your first fill (fluid added to your band) 6 - 8 weeks after surgery °• [facility] has a free Bariatric Surgery Support Group that meets monthly, the 3rd Thursday, 6pm. [employer] [facility] Classrooms. You can see classes online at [email or web address] °• It is very important to keep all follow up appointments with your surgeon, nutritionist, primary care physician, and behavioral health practitioner °o After the first year, please follow up with your bariatric surgeon and nutritionist at least once a year in order to maintain best weight loss results °      °             [facility]:  [phone] ° °             [facility]: [phone] ° °             Bariatric Nurse Coordinator: [phone]  °Gastric Bypass/Sleeve Home Care Instructions  Rev. 12/2012    ° °                                                    Reviewed and Endorsed °                                                   by [facility] Patient Education Committee, Jan, 2014 ° ° ° ° ° ° ° ° ° °

## 2017-07-23 NOTE — Transfer of Care (Signed)
Immediate Anesthesia Transfer of Care Note  Patient: Annette Smith  Procedure(s) Performed: Procedure(s): LAPAROSCOPIC GASTRIC SLEEVE RESECTION WITH HIATAL HERNIA REPAIR AND UPPER ENDOSCOPY (N/A)  Patient Location: PACU  Anesthesia Type:General  Level of Consciousness: sedated  Airway & Oxygen Therapy: Patient Spontanous Breathing and Patient connected to face mask oxygen  Post-op Assessment: Report given to RN and Post -op Vital signs reviewed and stable  Post vital signs: Reviewed and stable  Last Vitals:  Vitals:   07/23/17 0740  BP: (!) 99/57  Pulse: 91  Resp: 16  Temp: 37 C  SpO2: 100%    Last Pain:  Vitals:   07/23/17 0740  TempSrc: Oral      Patients Stated Pain Goal: 4 (07/23/17 0758)  Complications: No apparent anesthesia complications

## 2017-07-23 NOTE — Op Note (Signed)
Surgeon: Wenda Low, MD, FACS  Asst:  Ovidio Kin, MD,,FACS  Anes:  General endotracheal  Procedure: Single suture posterior hiatal hernia repair; Laparoscopic sleeve gastrectomy and upper endoscopy  Diagnosis: Morbid obesity  Complications: none  EBL:   10 cc  Description of Procedure:  The patient was take to OR 1 and given general anesthesia.  The abdomen was prepped with PCMX and draped sterilely.  A timeout was performed.  Access to the abdomen was achieved with a 5 mm Optiview through the left upper quadrant.  Following insufflation, the state of the abdomen was found to be full of omental incisions in the right upper quadrant and midline.  These were taken down with scissors and the Harmonic.  The sizing balloon was inserted and with 10 cc of air, there was prolapse.  A posterior repair was performed dissecting the right and left crura and placing an Endostitch and secured with a ty-KNot.  The ViSiGi 36Fr tube was inserted to deflate the stomach and was pulled back into the esophagus.    The pylorus was identified and we measured 5 cm back and marked the antrum.  At that point we began dissection to take down the greater curvature of the stomach using the Harmonic scalpel.  This dissection was taken all the way up to the left crus.  Posterior attachments of the stomach were also taken down.    The ViSiGi tube was then passed into the antrum and suction applied so that it was snug along the lessor curvature.  The "crow's foot" or incisura was identified.  The sleeve gastrectomy was begun using the [employer] stapler beginning with a 4.5 cm black load followed by a 6 cm black load followed by multple purple loads all with TRS.  When the sleeve was complete the tube was taken off suction and insufflated briefly.  The tube was withdrawn.  Upper endoscopy was then performed by Dr. Ezzard Standing and a symmetric cylinder with no bleeding or leaks was seen.     The specimen was extracted  through the 15 trocar site.  Wounds were infiltrated with Exparel and closed with 4-0 moncryl and Dermabond.  The 15 trocar was closed with two sutures of 0 vicryl using the PMI.    Matt B. Daphine Deutscher, MD, [facility], [place] [phone]

## 2017-07-23 NOTE — Op Note (Signed)
Name:  [name] MRN: [id number] Date of Surgery: 07/23/2017  Preop Diagnosis:  Morbid Obesity  Postop Diagnosis:  Morbid Obesity (Weight - 334, BMI - 60.5), S/P Gastric Sleeve resection  Procedure:  Upper endoscopy  (Intraoperative)  Surgeon:  Ovidio Kin, M.D.  Anesthesia:  GET  Indications for procedure: Annette Smith is a 66 y.o. female whose primary care physician is Toma Deiters, MD and has completed a gastric sleeve resection today for weight loss by Dr. Daphine Deutscher.  I am doing an intraoperative upper endoscopy to evaluate the gastric pouch after the sleeve gastrectomy.  Operative Note: The patient is under general anesthesia.  Dr. Daphine Deutscher is laparoscoping the patient while I do an upper endoscopy to evaluate the stomach pouch.  With the patient intubated, I passed the Pentax upper endoscope without difficulty down the esophagus.  The esophagus was unremarkable.  The esophago-gastric junction was at 36 cm.    The mucosa of the stomach looked viable and the staple line was intact without bleeding.  I advanced the scope to the pylorus, but did not go through it.  While I insufflated the stomach pouch with air, Dr. Daphine Deutscher  flooded the upper abdomen with saline to put the gastric pouch under saline.  There was no bubbling or evidence of a leak.  There was no evidence of narrowing of the pouch and the gastric sleeve looked tubular.  The scope was then withdrawn.  The esophagus was unremarkable and the patient tolerated the endoscopy without difficulty.  Ovidio Kin, MD, [facility] Pager: [phone] Office phone:  [phone]

## 2017-07-23 NOTE — Anesthesia Preprocedure Evaluation (Signed)
Anesthesia Evaluation  Patient identified by MRN, date of birth, ID band Patient awake    Reviewed: Allergy & Precautions, NPO status , Patient's Chart, lab work & pertinent test results  History of Anesthesia Complications (+) PONV  Airway Mallampati: II  TM Distance: >3 FB Neck ROM: Full    Dental no notable dental hx.    Pulmonary neg pulmonary ROS, asthma ,    Pulmonary exam normal breath sounds clear to auscultation       Cardiovascular hypertension, Pt. on medications negative cardio ROS Normal cardiovascular exam Rhythm:Regular Rate:Normal     Neuro/Psych negative neurological ROS  negative psych ROS   GI/Hepatic negative GI ROS, Neg liver ROS,   Endo/Other  negative endocrine ROS  Renal/GU negative Renal ROS  negative genitourinary   Musculoskeletal negative musculoskeletal ROS (+)   Abdominal   Peds negative pediatric ROS (+)  Hematology negative hematology ROS (+)   Anesthesia Other Findings   Reproductive/Obstetrics negative OB ROS                             Anesthesia Physical Anesthesia Plan  ASA: III  Anesthesia Plan: General   Post-op Pain Management:    Induction: Intravenous  PONV Risk Score and Plan: 4 or greater and Ondansetron, Dexamethasone, Midazolam and Scopolamine patch - Pre-op  Airway Management Planned: Oral ETT  Additional Equipment:   Intra-op Plan:   Post-operative Plan: Extubation in OR  Informed Consent: I have reviewed the patients History and Physical, chart, labs and discussed the procedure including the risks, benefits and alternatives for the proposed anesthesia with the patient or authorized representative who has indicated his/her understanding and acceptance.   Dental advisory given  Plan Discussed with: CRNA  Anesthesia Plan Comments:         Anesthesia Quick Evaluation

## 2017-07-23 NOTE — Interval H&P Note (Signed)
History and Physical Interval Note:  07/23/2017 9:48 AM  Annette Smith  has presented today for surgery, with the diagnosis of MORBID OBESITY  The various methods of treatment have been discussed with the patient and family. After consideration of risks, benefits and other options for treatment, the patient has consented to  Procedure(s): LAPAROSCOPIC GASTRIC SLEEVE RESECTION WITH UPPER ENDO (N/A) as a surgical intervention .  The patient's history has been reviewed, patient examined, no change in status, stable for surgery.  I have reviewed the patient's chart and labs.  Questions were answered to the patient's satisfaction.     Marilin Kofman B

## 2017-07-24 LAB — CBC WITH DIFFERENTIAL/PLATELET
Basophils Absolute: 0 10*3/uL (ref 0.0–0.1)
Basophils Relative: 0 %
EOS ABS: 0 10*3/uL (ref 0.0–0.7)
Eosinophils Relative: 0 %
HEMATOCRIT: 35.7 % — AB (ref 36.0–46.0)
HEMOGLOBIN: 12.1 g/dL (ref 12.0–15.0)
LYMPHS ABS: 1.1 10*3/uL (ref 0.7–4.0)
LYMPHS PCT: 10 %
MCH: 32.2 pg (ref 26.0–34.0)
MCHC: 33.9 g/dL (ref 30.0–36.0)
MCV: 94.9 fL (ref 78.0–100.0)
MONOS PCT: 3 %
Monocytes Absolute: 0.4 10*3/uL (ref 0.1–1.0)
NEUTROS ABS: 10.4 10*3/uL — AB (ref 1.7–7.7)
Neutrophils Relative %: 87 %
Platelets: 255 10*3/uL (ref 150–400)
RBC: 3.76 MIL/uL — ABNORMAL LOW (ref 3.87–5.11)
RDW: 15.5 % (ref 11.5–15.5)
WBC: 11.9 10*3/uL — ABNORMAL HIGH (ref 4.0–10.5)

## 2017-07-24 NOTE — Plan of Care (Signed)
Problem: Food- and Nutrition-Related Knowledge Deficit (NB-1.1) Goal: Nutrition education Formal process to instruct or train a patient/client in a skill or to impart knowledge to help patients/clients voluntarily manage or modify food choices and eating behavior to maintain or improve health. Outcome: Completed/Met Date Met: 07/24/17 Nutrition Education Note  Received consult for diet education per DROP protocol.   Discussed 2 week post op diet with pt. Emphasized that liquids must be non carbonated, non caffeinated, and sugar free. Fluid goals discussed. Pt to follow up with outpatient bariatric RD for further diet progression after 2 weeks. Multivitamins and minerals also reviewed. Teach back method used, pt expressed understanding, expect good compliance.   Diet: First 2 Weeks  You will see the nutritionist about two (2) weeks after your surgery. The nutritionist will increase the types of foods you can eat if you are handling liquids well:  If you have severe vomiting or nausea and cannot handle clear liquids lasting longer than 1 day, call your surgeon  Protein Shake  Drink at least 2 ounces of shake 5-6 times per day  Each serving of protein shakes (usually 8 - 12 ounces) should have a minimum of:  15 grams of protein  And no more than 5 grams of carbohydrate  Goal for protein each day:  Men = 80 grams per day  Women = 60 grams per day  Protein powder may be added to fluids such as non-fat milk or Lactaid milk or Soy milk (limit to 35 grams added protein powder per serving)   Hydration  Slowly increase the amount of water and other clear liquids as tolerated (See Acceptable Fluids)  Slowly increase the amount of protein shake as tolerated  Sip fluids slowly and throughout the day  May use sugar substitutes in small amounts (no more than 6 - 8 packets per day; i.e. Splenda)   Fluid Goal  The first goal is to drink at least 8 ounces of protein shake/drink per day (or as directed  by the nutritionist); some examples of protein shakes are Premier Protein, Johnson [employer] Johnson, [employer], EAS Edge HP, and Unjury. See handout from pre-op Bariatric Education Class:  Slowly increase the amount of protein shake you drink as tolerated  You may find it easier to slowly sip shakes throughout the day  It is important to get your proteins in first  Your fluid goal is to drink 64 - 100 ounces of fluid daily  It may take a few weeks to build up to this  32 oz (or more) should be clear liquids  And  32 oz (or more) should be full liquids (see below for examples)  Liquids should not contain sugar, caffeine, or carbonation   Clear Liquids:  Water or Sugar-free flavored water (i.e. Fruit H2O, Propel)  Decaffeinated coffee or tea (sugar-free)  Crystal Lite, Wyler's Lite, Minute Maid Lite  Sugar-free Jell-O  Bouillon or broth  Sugar-free Popsicle: *Less than 20 calories each; Limit 1 per day   Full Liquids:  Protein Shakes/Drinks + 2 choices per day of other full liquids  Full liquids must be:  No More Than 12 grams of Carbs per serving  No More Than 3 grams of Fat per serving  Strained low-fat cream soup  Non-Fat milk  Fat-free Lactaid Milk  Sugar-free yogurt (Dannon Lite & Fit, [place] yogurt, Oikos Zero)   Clayton Bibles, MS, RD, LDN Pager: [phone] After Hours Pager: [phone]

## 2017-07-24 NOTE — Progress Notes (Signed)
Patient alert and oriented, Post op day 1.  Provided support and encouragement.  Encouraged pulmonary toilet, ambulation and small sips of liquids.  Patient has completed 10 ounces of water.  Nausea has been an issue.  Just received IV Zofran, concerns for Morphine causing nausea.  Discussed with patient.  All questions answered.  Will continue to monitor.

## 2017-07-24 NOTE — Progress Notes (Signed)
Patient ID: Annette Smith, female   DOB: 06-Apr-1951, 66 y.o.   MRN: [id number] [facility] Progress Note:   1 Day Post-Op  Subjective: Mental status is alert;  She is complaining of being sore and her sinuses Objective: Vital signs in last 24 hours: Temp:  [97.4 F (36.3 C)-98.7 F (37.1 C)] 98.7 F (37.1 C) (09/19 0520) Pulse Rate:  [80-105] 80 (09/19 0520) Resp:  [15-29] 18 (09/19 0520) BP: (94-143)/(43-93) 126/81 (09/19 0520) SpO2:  [92 %-100 %] 97 % (09/19 0520) Weight:  [137.2 kg (302 lb 7.5 oz)] 137.2 kg (302 lb 7.5 oz) (09/19 0711)  Intake/Output from previous day: 09/18 0701 - 09/19 0700 In: 2863 [I.V.:2863] Out: 3310 [Urine:3300; Blood:10] Intake/Output this shift: No intake/output data recorded.  Physical Exam: Work of breathing is not labored.  Abdomen appropriately sore.  Lab Results:  Results for orders placed or performed during the hospital encounter of 07/23/17 (from the past 48 hour(s))  CBC     Status: Abnormal   Collection Time: 07/23/17  4:27 PM  Result Value Ref Range   WBC 17.5 (H) 4.0 - 10.5 K/uL   RBC 3.85 (L) 3.87 - 5.11 MIL/uL   Hemoglobin 12.4 12.0 - 15.0 g/dL   HCT 37.1 36.0 - 46.0 %   MCV 96.4 78.0 - 100.0 fL   MCH 32.2 26.0 - 34.0 pg   MCHC 33.4 30.0 - 36.0 g/dL   RDW 15.6 (H) 11.5 - 15.5 %   Platelets 243 150 - 400 K/uL  Creatinine, serum     Status: Abnormal   Collection Time: 07/23/17  4:27 PM  Result Value Ref Range   Creatinine, Ser 2.23 (H) 0.44 - 1.00 mg/dL   GFR calc non Af Amer 22 (L) >60 mL/min   GFR calc Af Amer 25 (L) >60 mL/min    Comment: (NOTE) The eGFR has been calculated using the CKD EPI equation. This calculation has not been validated in all clinical situations. eGFR's persistently <60 mL/min signify possible Chronic Kidney Disease.   Glucose, capillary     Status: Abnormal   Collection Time: 07/23/17  8:30 PM  Result Value Ref Range   Glucose-Capillary 164 (H) 65 - 99 mg/dL  CBC WITH DIFFERENTIAL      Status: Abnormal   Collection Time: 07/24/17  5:18 AM  Result Value Ref Range   WBC 11.9 (H) 4.0 - 10.5 K/uL   RBC 3.76 (L) 3.87 - 5.11 MIL/uL   Hemoglobin 12.1 12.0 - 15.0 g/dL   HCT 35.7 (L) 36.0 - 46.0 %   MCV 94.9 78.0 - 100.0 fL   MCH 32.2 26.0 - 34.0 pg   MCHC 33.9 30.0 - 36.0 g/dL   RDW 15.5 11.5 - 15.5 %   Platelets 255 150 - 400 K/uL   Neutrophils Relative % 87 %   Neutro Abs 10.4 (H) 1.7 - 7.7 K/uL   Lymphocytes Relative 10 %   Lymphs Abs 1.1 0.7 - 4.0 K/uL   Monocytes Relative 3 %   Monocytes Absolute 0.4 0.1 - 1.0 K/uL   Eosinophils Relative 0 %   Eosinophils Absolute 0.0 0.0 - 0.7 K/uL   Basophils Relative 0 %   Basophils Absolute 0.0 0.0 - 0.1 K/uL    Radiology/Results: No results found.  Anti-infectives: Anti-infectives    Start     Dose/Rate Route Frequency Ordered Stop   07/23/17 0732  cefoTEtan in Dextrose 5% (CEFOTAN) IVPB 2 g     2 g Intravenous On  call to O.R. 07/23/17 0732 07/23/17 1020      Assessment/Plan: Problem List: Patient Active Problem List   Diagnosis Date Noted  . S/P laparoscopic sleeve gastrectomy Sept 2018 07/23/2017    Will begin trial of liquids and see how she advances.   1 Day Post-Op    LOS: 1 day   Matt B. Hassell Done, MD, [facility] [phone] beeper [phone]  07/24/2017 8:35 AM

## 2017-07-25 LAB — CBC WITH DIFFERENTIAL/PLATELET
BASOS PCT: 0 %
Basophils Absolute: 0 10*3/uL (ref 0.0–0.1)
EOS ABS: 0 10*3/uL (ref 0.0–0.7)
EOS PCT: 0 %
HCT: 34.5 % — ABNORMAL LOW (ref 36.0–46.0)
Hemoglobin: 11.6 g/dL — ABNORMAL LOW (ref 12.0–15.0)
Lymphocytes Relative: 14 %
Lymphs Abs: 1.8 10*3/uL (ref 0.7–4.0)
MCH: 32.4 pg (ref 26.0–34.0)
MCHC: 33.6 g/dL (ref 30.0–36.0)
MCV: 96.4 fL (ref 78.0–100.0)
MONO ABS: 0.7 10*3/uL (ref 0.1–1.0)
MONOS PCT: 6 %
Neutro Abs: 10.7 10*3/uL — ABNORMAL HIGH (ref 1.7–7.7)
Neutrophils Relative %: 80 %
Platelets: 240 10*3/uL (ref 150–400)
RBC: 3.58 MIL/uL — ABNORMAL LOW (ref 3.87–5.11)
RDW: 16 % — AB (ref 11.5–15.5)
WBC: 13.3 10*3/uL — ABNORMAL HIGH (ref 4.0–10.5)

## 2017-07-25 MED ORDER — PROMETHAZINE HCL 12.5 MG PO TABS: 12.5000 mg | ORAL_TABLET | Freq: Four times a day (QID) | ORAL | 0 refills | Status: AC | PRN

## 2017-07-25 MED ORDER — GLYCERIN (LAXATIVE) 2.1 G RE SUPP
1.0000 | Freq: Once | RECTAL | Status: AC
  Administered 2017-07-25: 1 via RECTAL
  Filled 2017-07-25: qty 1

## 2017-07-25 NOTE — Discharge Summary (Signed)
Physician Discharge Summary  Patient ID: Annette Smith MRN: [id number] DOB/AGE: August 19, 1951 66 y.o.  Admit date: 07/23/2017 Discharge date: 07/25/2017  Admission Diagnoses:  Morbid obesity  Discharge Diagnoses:  same  Principal Problem:   S/P laparoscopic sleeve gastrectomy Sept 2018   Surgery:  Lap sleeve gastrectomy with repair of hiatus hernia  Discharged Condition: improved  Hospital Course:   Had surgery.  Begun on liquids and advanced;  Ready for discharge on PD 2  Consults: none  Significant Diagnostic Studies: none    Discharge Exam: Blood pressure 116/69, pulse (!) 102, temperature 98.1 F (36.7 C), temperature source Oral, resp. rate 20, height 5' 0.5" (1.537 m), weight 129.5 kg (285 lb 6.4 oz), SpO2 97 %, unknown if currently breastfeeding. Incisions OK  Disposition: Final discharge disposition not confirmed  Discharge Instructions    Ambulate hourly while awake    Complete by:  As directed    Call MD for:  difficulty breathing, headache or visual disturbances    Complete by:  As directed    Call MD for:  persistant dizziness or light-headedness    Complete by:  As directed    Call MD for:  persistant nausea and vomiting    Complete by:  As directed    Call MD for:  redness, tenderness, or signs of infection (pain, swelling, redness, odor or green/yellow discharge around incision site)    Complete by:  As directed    Call MD for:  severe uncontrolled pain    Complete by:  As directed    Call MD for:  temperature >101 F    Complete by:  As directed    Diet bariatric full liquid    Complete by:  As directed    Incentive spirometry    Complete by:  As directed    Perform hourly while awake     Allergies as of 07/25/2017      Reactions   Aspartame And Phenylalanine Hives   Barbiturates Rash   Donnatal - severe rash   Clindamycin/lincomycin Nausea And Vomiting, Other (See Comments)   "Deadly flu-like symptoms."  Can't take any MYCINS   Diphtheria  Toxoid-containing Vaccines Other (See Comments)   Pt is unsure of reaction   Erythromycin Nausea And Vomiting, Other (See Comments)   "Deadly flu-like symptoms; fever, too."  Was told never to take "ANY MYCINS."   Other Nausea And Vomiting, Other (See Comments)   Pt states all MYCINS cause severe "flu-like symptoms, fever;" was told never to take ANY MYCINS.   Adhesive [tape] Dermatitis   "Blisters, bruising with (sugical) tape; have to use paper tape."   Ibuprofen Other (See Comments)   "knots in stomach that draw me double"   Pb-hyoscy-atropine-scopolamine Itching   "skin crawling"   Tetanus Toxoids Other (See Comments)   Arm swells and red streaks   Gabapentin Other (See Comments)   Unknown   Isosorbide Nausea And Vomiting   Shortness of breath   Tramadol Other (See Comments)   hallucinations      Medication List    TAKE these medications   acetaminophen 500 MG tablet Commonly known as:  TYLENOL Take 1,000 mg by mouth every 8 (eight) hours as needed for mild pain or headache.   albuterol 108 (90 Base) MCG/ACT inhaler Commonly known as:  PROVENTIL HFA;VENTOLIN HFA Inhale 2 puffs into the lungs every 6 (six) hours as needed for wheezing or shortness of breath.   allopurinol 300 MG tablet Commonly known as:  ZYLOPRIM  Take 300 mg by mouth daily with breakfast.   amLODipine 5 MG tablet Commonly known as:  NORVASC Take 5 mg by mouth daily with breakfast. Notes to patient:  Monitor Blood Pressure Daily and keep a log for primary care physician.  You may need to make changes to your medications with rapid weight loss.     ARTIFICIAL TEAR OP Place 2 drops into both eyes 3 (three) times daily as needed (dry eyes).   ASPERCREME W/LIDOCAINE EX Apply topically as needed.   aspirin EC 81 MG tablet Take 81 mg by mouth daily.   benazepril 10 MG tablet Commonly known as:  LOTENSIN Take 10 mg by mouth daily with breakfast. Notes to patient:  Monitor Blood Pressure Daily and  keep a log for primary care physician.  You may need to make changes to your medications with rapid weight loss.     Biotin 1 MG Caps Take 1 mg by mouth daily with lunch.   BREO ELLIPTA 100-25 MCG/INH Aepb Generic drug:  fluticasone furoate-vilanterol Inhale 1 puff into the lungs daily.   CALCIUM 600+D 600-200 MG-UNIT Tabs Generic drug:  Calcium Carbonate-Vitamin D Take 1 tablet by mouth daily with lunch.   diazepam 5 MG tablet Commonly known as:  VALIUM Take 2.5 mg by mouth daily as needed for anxiety.   dicyclomine 20 MG tablet Commonly known as:  BENTYL Take 20 mg by mouth daily before breakfast.   furosemide 20 MG tablet Commonly known as:  LASIX Take 20 mg by mouth daily with breakfast. Notes to patient:  Monitor Blood Pressure Daily and keep a log for primary care physician.  Monitor for symptoms of dehydration.  You may need to make changes to your medications with rapid weight loss.     GENTEAL OP Place 1 application into both eyes at bedtime. Must be preservative free   HYDROcodone-acetaminophen 7.5-325 MG tablet Commonly known as:  NORCO Take 1 tablet by mouth every 6 (six) hours as needed for moderate pain.   levocetirizine 5 MG tablet Commonly known as:  XYZAL Take 5 mg by mouth at bedtime.   levothyroxine 100 MCG tablet Commonly known as:  SYNTHROID, LEVOTHROID Take 100 mcg by mouth daily before breakfast.   loratadine 10 MG tablet Commonly known as:  CLARITIN Take 10 mg by mouth daily with breakfast.   multivitamin with minerals Tabs tablet Take 1 tablet by mouth See admin instructions. Takes 1 at 11AM and 1 at 2pm.   naproxen 500 MG tablet Commonly known as:  NAPROSYN Take 500 mg by mouth 2 (two) times daily with a meal. Notes to patient:  Avoid NSAIDs for 6-8 weeks after surgery   pantoprazole 40 MG tablet Commonly known as:  PROTONIX Take 40 mg by mouth daily.   promethazine 25 MG tablet Commonly known as:  PHENERGAN Take 25 mg by mouth  every 6 (six) hours as needed for nausea or vomiting. What changed:  Another medication with the same name was added. Make sure you understand how and when to take each.   promethazine 12.5 MG tablet Commonly known as:  PHENERGAN Take 1 tablet (12.5 mg total) by mouth every 6 (six) hours as needed for nausea or vomiting. What changed:  You were already taking a medication with the same name, and this prescription was added. Make sure you understand how and when to take each.   sertraline 50 MG tablet Commonly known as:  ZOLOFT Take 25 mg by mouth at bedtime.  spironolactone 25 MG tablet Commonly known as:  ALDACTONE Take 50 mg by mouth daily with breakfast. Notes to patient:  Monitor Blood Pressure Daily and keep a log for primary care physician.  Monitor for symptoms of dehydration.  You may need to make changes to your medications with rapid weight loss.     vitamin C 500 MG tablet Commonly known as:  ASCORBIC ACID Take 1,000 mg by mouth daily with supper.            Discharge Care Instructions        Start     Ordered   07/25/17 0000  promethazine (PHENERGAN) 12.5 MG tablet  Every 6 hours PRN     07/25/17 1441   07/25/17 0000  Diet bariatric full liquid     07/25/17 1441   07/25/17 0000  Ambulate hourly while awake     07/25/17 1441   07/25/17 0000  Incentive spirometry    Comments:  Perform hourly while awake   07/25/17 1441   07/25/17 0000  Call MD for:  temperature >101 F     07/25/17 1441   07/25/17 0000  Call MD for:  persistant nausea and vomiting     07/25/17 1441   07/25/17 0000  Call MD for:  severe uncontrolled pain     07/25/17 1441   07/25/17 0000  Call MD for:  redness, tenderness, or signs of infection (pain, swelling, redness, odor or green/yellow discharge around incision site)     07/25/17 1441   07/25/17 0000  Call MD for:  difficulty breathing, headache or visual disturbances     07/25/17 1441   07/25/17 0000  Call MD for:  persistant dizziness  or light-headedness     07/25/17 1441     Follow-up Information    Luretha Murphy, MD. Go on 08/07/2017.   Specialty:  General Surgery Why:  at [facility] information: [address] [place] [address] [phone]        Luretha Murphy, MD Follow up.   Specialty:  General Surgery Contact information: [address] [place] [address] [phone]           Signed: Valarie Merino 07/25/2017, 2:41 PM

## 2017-07-25 NOTE — Progress Notes (Signed)
Patient being discharged home, discharged instructions given to patient and spouse   63 RN

## 2017-07-25 NOTE — Progress Notes (Signed)
Patient alert and oriented, Post op day 2.  Provided support and encouragement.  Encouraged pulmonary toilet, ambulation and small sips of liquids. Drinking protein shake this am.   All questions answered.  Will continue to monitor.

## 2017-07-25 NOTE — Progress Notes (Signed)
Patient alert and oriented, pain is controlled. Patient is tolerating fluids, advanced to protein shake today, patient is tolerating well.  Reviewed Gastric sleeve discharge instructions with patient and patient is able to articulate understanding.  Provided information on BELT program, Support Group and [facility] outpatient pharmacy. All questions answered, will continue to monitor.  

## 2017-07-29 ENCOUNTER — Telehealth (HOSPITAL_COMMUNITY): Payer: Self-pay

## 2017-07-29 NOTE — Telephone Encounter (Signed)
Made discharge phone call to patientl. Asking the following questions.    1. Do you have someone to care for you now that you are home?  Independent  2. Are you having pain now that is not relieved by your pain medication?  Stopped pain medicine 3 days ago 3. Are you able to drink the recommended daily amount of fluids (48 ounces minimum/day) and protein (60-80 grams/day) as prescribed by the dietitian or nutritional counselor?  60 grams of protein,  Drinking fluid all day doesn't keep track of amount.  Asked patient to track and call me back with total  tomorrow 4. Are you taking the vitamins and minerals as prescribed?  Yes  5. Do you have the "on call" number to contact your surgeon if you have a problem or question?  yes 6. Are your incisions free of redness, swelling or drainage? (If steri strips, address that these can fall off, shower as tolerated) incisions 7. Have your bowels moved since your surgery?  If not, are you passing gas?  Having diarrhea, has taken Imodium, instructed to call Dr Ermalene Searing office , urinating a lot per patient 8. Are you up and walking 3-4 times per day?  Ambulating around house 9. Were you provided your discharge medications before your surgery or before you were discharged from the hospital and are you taking them without problem?

## 2017-07-30 DIAGNOSIS — I10 Essential (primary) hypertension: Secondary | ICD-10-CM | POA: Diagnosis not present

## 2017-07-30 DIAGNOSIS — Z6841 Body Mass Index (BMI) 40.0 and over, adult: Secondary | ICD-10-CM | POA: Diagnosis not present

## 2017-07-30 DIAGNOSIS — J4520 Mild intermittent asthma, uncomplicated: Secondary | ICD-10-CM | POA: Diagnosis not present

## 2017-07-30 DIAGNOSIS — K219 Gastro-esophageal reflux disease without esophagitis: Secondary | ICD-10-CM | POA: Diagnosis not present

## 2017-07-30 DIAGNOSIS — E6609 Other obesity due to excess calories: Secondary | ICD-10-CM | POA: Diagnosis not present

## 2017-08-06 ENCOUNTER — Encounter: Payer: Medicare HMO | Attending: Surgery | Admitting: Skilled Nursing Facility1

## 2017-08-06 DIAGNOSIS — Z6841 Body Mass Index (BMI) 40.0 and over, adult: Secondary | ICD-10-CM | POA: Diagnosis not present

## 2017-08-06 DIAGNOSIS — Z713 Dietary counseling and surveillance: Secondary | ICD-10-CM | POA: Diagnosis not present

## 2017-08-06 DIAGNOSIS — E669 Obesity, unspecified: Secondary | ICD-10-CM

## 2017-08-07 ENCOUNTER — Encounter: Payer: Self-pay | Admitting: Skilled Nursing Facility1

## 2017-08-07 NOTE — Progress Notes (Signed)
Bariatric Class:  Appt start time: 1530 end time:  1630.  2 Week Post-Operative Nutrition Class  Patient was seen on 08/07/2017 for Post-Operative Nutrition [facility].   Surgery date: 07/23/2017 Surgery type: Sleeve gastrectomy Start weight at [facility]: 335.0 Weight today: 299.2  TANITA  BODY COMP RESULTS  08/07/2017   BMI (kg/m^2) 53.7   Fat Mass (lbs) 149   Fat Free Mass (lbs) 130.4   Total Body Water (lbs) 96   The following the learning objectives were met by the patient during this course:  Identifies Phase 3A (Soft, High Proteins) Dietary Goals and will begin from 2 weeks post-operatively to 2 months post-operatively  Identifies appropriate sources of fluids and proteins   States protein recommendations and appropriate sources post-operatively  Identifies the need for appropriate texture modifications, mastication, and bite sizes when consuming solids  Identifies appropriate multivitamin and calcium sources post-operatively  Describes the need for physical activity post-operatively and will follow MD recommendations  States when to call healthcare provider regarding medication questions or post-operative complications  Handouts given during class include:  Phase 3A: Soft, High Protein Diet Handout  Follow-Up Plan: Patient will follow-up at [facility] in 6 weeks for 2 month post-op nutrition visit for diet advancement per MD.

## 2017-08-09 ENCOUNTER — Telehealth: Payer: Self-pay | Admitting: Registered"

## 2017-08-09 NOTE — Telephone Encounter (Signed)
Pt called to clarify some confusion on not drinking protein shakes 3 weeks post-op sleeve gastrectomy. Pt states she feels that she is unable to consume 60 grams of protein by eating food alone. RD encouraged to patient to aim to get as much protein of her goal (60 grams) by eating food and can supplement with protein shakes, as needed. Pt states she needs to know a schedule of eating like she has with taking her medications. RD emailed patient a sample schedule of eating to help. See below:    Good morning Annette Smith,  I hope you're doing well! Here is a sample schedule of what your eating could look like daily. Be sure to track protein and fluid amounts to be self-aware of where you are with meeting your daily goals:  1. At least 60 grams of protein  2. At least 64 oz. of fluid   Sample Day Breakfast   1-2 eggs (6-12g)  Snack   1 oz. Low-fat cheese (7g) Lunch  1 oz. Low-fat deli meat and 1 oz. low-fat   cheese (14g) Snack   [place] yogurt (15g) Dinner   1-2 oz lean protein (7-14g) Snack  1 oz cottage cheese (7g) Total protein = 56 - 69 grams  I hope this helps. Let me know if you have further questions or concerns. Have a great weekend!  Thank you, Mirian Capuchin, RD, LDN [facility]  [facility] Registered [employer] [employer] Direct Dial: [phone]  Fax: [phone] Website: [email or web address]

## 2017-08-21 DIAGNOSIS — E6609 Other obesity due to excess calories: Secondary | ICD-10-CM | POA: Diagnosis not present

## 2017-08-21 DIAGNOSIS — I10 Essential (primary) hypertension: Secondary | ICD-10-CM | POA: Diagnosis not present

## 2017-08-21 DIAGNOSIS — J4520 Mild intermittent asthma, uncomplicated: Secondary | ICD-10-CM | POA: Diagnosis not present

## 2017-08-21 DIAGNOSIS — Z79899 Other long term (current) drug therapy: Secondary | ICD-10-CM | POA: Diagnosis not present

## 2017-08-21 DIAGNOSIS — Z6841 Body Mass Index (BMI) 40.0 and over, adult: Secondary | ICD-10-CM | POA: Diagnosis not present

## 2017-08-21 DIAGNOSIS — K219 Gastro-esophageal reflux disease without esophagitis: Secondary | ICD-10-CM | POA: Diagnosis not present

## 2017-08-27 ENCOUNTER — Telehealth: Payer: Self-pay | Admitting: Skilled Nursing Facility1

## 2017-08-27 NOTE — Telephone Encounter (Signed)
Pt states her PCP states her vitamin b12 is excessively high.   Her celebrate multivitamin has and was taking 2  -Take one of your multivitamins until you are out and then switch to pro-care  -Take pro-care health multivitamin

## 2017-09-06 DIAGNOSIS — E038 Other specified hypothyroidism: Secondary | ICD-10-CM | POA: Diagnosis not present

## 2017-09-06 DIAGNOSIS — J441 Chronic obstructive pulmonary disease with (acute) exacerbation: Secondary | ICD-10-CM | POA: Diagnosis not present

## 2017-09-06 DIAGNOSIS — I10 Essential (primary) hypertension: Secondary | ICD-10-CM | POA: Diagnosis not present

## 2017-09-17 ENCOUNTER — Encounter: Payer: Self-pay | Admitting: Registered"

## 2017-09-17 ENCOUNTER — Encounter: Payer: Medicare HMO | Attending: Surgery | Admitting: Registered"

## 2017-09-17 DIAGNOSIS — E669 Obesity, unspecified: Secondary | ICD-10-CM

## 2017-09-17 DIAGNOSIS — Z6841 Body Mass Index (BMI) 40.0 and over, adult: Secondary | ICD-10-CM | POA: Insufficient documentation

## 2017-09-17 DIAGNOSIS — Z713 Dietary counseling and surveillance: Secondary | ICD-10-CM | POA: Diagnosis not present

## 2017-09-17 NOTE — Progress Notes (Signed)
Follow-up visit:  8 Weeks Post-Operative Sleeve Gastrectomy Surgery  Medical Nutrition Therapy:  Appt start time: 11:35 end time:  12:30.  Primary concerns today: Post-operative Bariatric Surgery Nutrition Management.  Non scale victories: breathe better,    Surgery date: 07/23/2017 Surgery type: Sleeve gastrectomy Start weight at [facility]: 335.0 Weight today: 267.3 lbs Weight change: 11.9 lbs loss from 279.2 on 08/07/2017 Total weight lost: 67.7 lbs loss Weight loss goal: breathe, live, and lose weight and maintaining it  TANITA  BODY COMP RESULTS  08/07/2017 09/17/2017   BMI (kg/m^2) 53.7 N/A   Fat Mass (lbs) 149    Fat Free Mass (lbs) 130.4    Total Body Water (lbs) 96     Pt states she has had shingles since last visit. Pt states she is only taking one MVI/day due to recent B12 values being too high. Pt states she does not think to drink during her day. Pt states she has experienced feeling lightheaded and dizzy one time when cleaning as well as getting dressed in the morning. Pt states she had carrots once day and wants to eat a variety for Thanksgiving Day. Pt states she loves mac and cheese and stuffing.  Pt is not consistently meeting protein and fluid goals.     Preferred Learning Style:   No preference indicated   Learning Readiness:   Contemplating  Ready  Change in progress  24-hr recall: B (AM): egg (6g) Snk (AM): none  L (PM): 1/2 c pinto beans (8g) Snk (PM): none  D (PM): 1 oz stew beef (7g), 1/2 c pinto beans (8g) Snk (PM): greek yogurt (15g)  Fluid intake: coffee (20 oz), water, decaf tea; ~30-40 ounces Estimated total protein intake: 44 grams; avgs 40-56 grams protein  Medications: See list; added Crestor Supplementation: 1 Celebrate + Calcium 3 x day  Using straws: no Drinking while eating: no Having you been chewing well: yes Chewing/swallowing difficulties: no Changes in vision: no Changes to mood/headaches: no Hair loss/Changes to  skin/Changes to nails: no, no, no Any difficulty focusing or concentrating: no Sweating: no Dizziness/Lightheaded: sometimes Palpitations: no  Carbonated beverages: no N/V/D/C/GAS: no, no, no, no, yes Abdominal Pain: no Dumping syndrome: no Last Lap-Band fill: N/A  Recent physical activity:  ADLs  Progress Towards Goal(s):  In progress.  Handouts given during visit include:  Snack ideas for bariatric patients   Nutritional Diagnosis:  Inadequate fluid intake As related to bariatric surgery post-op recommendations.  As evidenced by pt report of less than 64 fluid ounces a day. NI-5.7.1 Inadequate protein intake As related to bariatric surgery post-op recommendations.  As evidenced by pt recall of less than 60 grams of protein daily.    Intervention:  Nutrition education and counseling. Pt was educated and counseled on the importance of meeting protein and fluid needs consistently. Goals: - Add in snacks throughout the day to increase protein intake. - Aim for daily protein intake of at least 60 grams per day. - Aim for at least 64 ounces of fluid a day.  - Continue tracking protein and fluid intake.   Teaching Method Utilized:  Visual Auditory Hands on  Barriers to learning/adherence to lifestyle change: contemplative stage of change  Demonstrated degree of understanding via:  Teach Back   Monitoring/Evaluation:  Dietary intake, exercise, lap band fills, and body weight. Follow up in 2 weeks for 10 week post-op visit.

## 2017-09-17 NOTE — Patient Instructions (Addendum)
-   Add in snacks throughout the day to increase protein intake.  - Aim for daily protein intake of at least 60 grams per day.  - Aim for at least 64 ounces of fluid a day.   - Continue tracking protein and fluid intake.

## 2017-10-01 ENCOUNTER — Encounter: Payer: Medicare HMO | Admitting: Registered"

## 2017-10-01 ENCOUNTER — Encounter: Payer: Self-pay | Admitting: Registered"

## 2017-10-01 DIAGNOSIS — E669 Obesity, unspecified: Secondary | ICD-10-CM

## 2017-10-01 DIAGNOSIS — Z6841 Body Mass Index (BMI) 40.0 and over, adult: Secondary | ICD-10-CM | POA: Diagnosis not present

## 2017-10-01 DIAGNOSIS — Z713 Dietary counseling and surveillance: Secondary | ICD-10-CM | POA: Diagnosis not present

## 2017-10-01 NOTE — Patient Instructions (Signed)
-   Continue to increase fluid intake.   - Continue to track protein and fluid intake.   - Continue to consume at least 60 grams of protein a day.   - You are doing a great job with making improvements!

## 2017-10-01 NOTE — Progress Notes (Signed)
Follow-up visit:  10 Weeks Post-Operative Sleeve Gastrectomy Surgery  Medical Nutrition Therapy:  Appt start time: 11:50 end time:  12:15  Primary concerns today: Post-operative Bariatric Surgery Nutrition Management.  Non scale victories: breathe better   Surgery date: 07/23/2017 Surgery type: Sleeve gastrectomy Start weight at [facility]: 335.0 Weight today:  267.8 lbs Weight change: maintained weight from 267.2 on 11/13//2018 Total weight lost: 67.2 lbs loss Weight loss goal: breathe, live, and lose weight and maintaining it  TANITA  BODY COMP RESULTS  08/07/2017 09/17/2017 10/01/2017   BMI (kg/m^2) 53.7 N/A N/A   Fat Mass (lbs) 149     Fat Free Mass (lbs) 130.4     Total Body Water (lbs) 65      Pt arrives with husband. Pt states she is still working on increasing fluid intake; not meeting fluid intake. Pt states she is doing better with meeting protein goals more regularly; averaging closer to 50-80 grams a day. Pt states she had a small amount of mac and cheese, stuffing, gravy, chocolate cake, and a mini red velvet cake within the last 2 weeks. Pt states she tolerated them well.   Pt states she is only taking one MVI/day due to recent B12 values being too high. Pt states she does not think to drink during her day. Pt states she has experienced feeling lightheaded and dizzy one time when cleaning as well as getting dressed in the morning. Pt states she loves mac and cheese and stuffing.    Preferred Learning Style:   No preference indicated   Learning Readiness:   Contemplating  Ready  Change in progress  24-hr recall: B (AM): egg (6g), [place] sausage link , baked ham (7g) Snk (AM): none  L (PM): 1/2 c pinto beans (8g) Snk (PM): greek yogurt (15g) D (PM): 6 oz baked bbq chicken (42g), 1/2 c pinto beans (8g) Snk (PM): none  Fluid intake: coffee (20 oz), water, decaf tea (15 oz); ~30-60 ounces Estimated total protein intake: 93 grams; avgs 50-93g grams  protein  Medications: See list; added Crestor Supplementation: 1 Celebrate (because B12 was too high) + Calcium 3 x day  Using straws: no Drinking while eating: no Having you been chewing well: yes Chewing/swallowing difficulties: no Changes in vision: no Changes to mood/headaches: no Hair loss/Changes to skin/Changes to nails: no, no, no Any difficulty focusing or concentrating: no Sweating: no Dizziness/Lightheaded: sometimes Palpitations: no  Carbonated beverages: no N/V/D/C/GAS: no, no, no, no, yes Abdominal Pain: no Dumping syndrome: no Last Lap-Band fill: N/A  Recent physical activity:  ADLs, takes the long way to restroom, walking inside home several times  Progress Towards Goal(s):  In progress.  Handouts given during visit include:  Snack ideas for bariatric patients   Nutritional Diagnosis:  Inadequate fluid intake As related to bariatric surgery post-op recommendations.  As evidenced by pt report of less than 64 fluid ounces a day.    Intervention:  Nutrition education and counseling. Pt was educated and counseled on the importance of meeting protein and fluid needs consistently. Goals: - Continue to increase fluid intake.  - Continue to track protein and fluid intake.  - Continue to consume at least 60 grams of protein a day.  - You are doing a great job with making improvements!  Teaching Method Utilized:  Visual Auditory Hands on  Barriers to learning/adherence to lifestyle change: contemplative stage of change  Demonstrated degree of understanding via:  Teach Back   Monitoring/Evaluation:  Dietary intake, exercise, lap  band fills, and body weight. Follow up in 6 weeks for 4 month post-op visit.

## 2017-11-13 DIAGNOSIS — K210 Gastro-esophageal reflux disease with esophagitis: Secondary | ICD-10-CM | POA: Diagnosis not present

## 2017-11-13 DIAGNOSIS — Z6841 Body Mass Index (BMI) 40.0 and over, adult: Secondary | ICD-10-CM | POA: Diagnosis not present

## 2017-11-13 DIAGNOSIS — F3289 Other specified depressive episodes: Secondary | ICD-10-CM | POA: Diagnosis not present

## 2017-11-13 DIAGNOSIS — I10 Essential (primary) hypertension: Secondary | ICD-10-CM | POA: Diagnosis not present

## 2017-11-13 DIAGNOSIS — E038 Other specified hypothyroidism: Secondary | ICD-10-CM | POA: Diagnosis not present

## 2017-11-13 DIAGNOSIS — J4520 Mild intermittent asthma, uncomplicated: Secondary | ICD-10-CM | POA: Diagnosis not present

## 2017-12-03 ENCOUNTER — Ambulatory Visit: Payer: Self-pay | Admitting: Registered"

## 2017-12-03 DIAGNOSIS — F3289 Other specified depressive episodes: Secondary | ICD-10-CM | POA: Diagnosis not present

## 2017-12-03 DIAGNOSIS — K210 Gastro-esophageal reflux disease with esophagitis: Secondary | ICD-10-CM | POA: Diagnosis not present

## 2017-12-03 DIAGNOSIS — I10 Essential (primary) hypertension: Secondary | ICD-10-CM | POA: Diagnosis not present

## 2017-12-03 DIAGNOSIS — E038 Other specified hypothyroidism: Secondary | ICD-10-CM | POA: Diagnosis not present

## 2017-12-09 ENCOUNTER — Encounter: Payer: Medicare HMO | Attending: Surgery | Admitting: Registered"

## 2017-12-09 ENCOUNTER — Encounter: Payer: Self-pay | Admitting: Registered"

## 2017-12-09 DIAGNOSIS — F3289 Other specified depressive episodes: Secondary | ICD-10-CM | POA: Diagnosis not present

## 2017-12-09 DIAGNOSIS — Z6841 Body Mass Index (BMI) 40.0 and over, adult: Secondary | ICD-10-CM | POA: Diagnosis not present

## 2017-12-09 DIAGNOSIS — I10 Essential (primary) hypertension: Secondary | ICD-10-CM | POA: Diagnosis not present

## 2017-12-09 DIAGNOSIS — Z713 Dietary counseling and surveillance: Secondary | ICD-10-CM | POA: Diagnosis not present

## 2017-12-09 DIAGNOSIS — E038 Other specified hypothyroidism: Secondary | ICD-10-CM | POA: Diagnosis not present

## 2017-12-09 DIAGNOSIS — K210 Gastro-esophageal reflux disease with esophagitis: Secondary | ICD-10-CM | POA: Diagnosis not present

## 2017-12-09 DIAGNOSIS — E669 Obesity, unspecified: Secondary | ICD-10-CM

## 2017-12-09 NOTE — Patient Instructions (Signed)
-   Keep protein snacks handy during conference, such as greek yogurt, protein shakes, and chicken packs.   - Continue to increase fluid intake.

## 2017-12-09 NOTE — Progress Notes (Signed)
Follow-up visit: 5 Months Post-Operative Sleeve Gastrectomy Surgery  Medical Nutrition Therapy:  Appt start time: 2:00 end time:  2:45  Primary concerns today: Post-operative Bariatric Surgery Nutrition Management.  Non scale victories: breathe better, can tolerate more protein options  Surgery date: 07/23/2017 Surgery type: Sleeve gastrectomy Start weight at [facility]: 335.0 Weight today: 253 lbs (pt reported) Weight change: 14.8 lbs from 267.8 (using today's pt reported weight) on 11/27//2018 Total weight lost: 82 lbs loss (using today's pt reported weight) Weight loss goal: breathe, live, and lose weight and maintain it  TANITA  BODY COMP RESULTS  08/07/2017 09/17/2017 10/01/2017   BMI (kg/m^2) 53.7 N/A N/A   Fat Mass (lbs) 149     Fat Free Mass (lbs) 130.4     Total Body Water (lbs) 87      Pt arrives with husband. Pt states she has started taking biotin 10,000 mcg because hair is falling out. Pt states she usually loses hair after she has surgery and the same happened with her sister. Pt states she cannot tolerate green beans, lima beans, or fruit well. Pt  reports wanting to eat [place] food with rice as well as chicken wings with ranch dressing. Pt states they are going to a conference tomorrow and expresses concern about what to eat while there. Pt states she is still working on increasing fluid intake; averaging about 50+ ounces a day.   Pt states she is still working on increasing fluid intake; not meeting fluid intake. Pt states she is doing better with meeting protein goals more regularly; averaging closer to 50-80 grams a day.   Pt states she is only taking one MVI/day due to recent B12 values being too high. Pt states she does not think to drink during her day. Pt states she loves mac and cheese and stuffing.    Preferred Learning Style:   No preference indicated   Learning Readiness:   Contemplating  Ready  Change in progress  24-hr recall: B (AM): egg (6g),  [place] sausage link, baked ham (7g) Snk (AM): none  L (PM): nachos (13g) or 1/2 c pinto beans (8g) Snk (PM): sometimes greek yogurt (15g) D (PM): 8 broiled shrimp (30g), 8 oysters (10g) or 6 oz baked bbq chicken (42g), 1/2 c pinto beans (8g) Snk (PM): none  Fluid intake: coffee (20 oz), water, decaf tea (15 oz); ~50+ ounces Estimated total protein intake: avgs 50-93g grams protein  Medications: See list; added Crestor Supplementation: 1 Celebrate (because B12 was too high) + Calcium 3 x day + biotin  Using straws: no Drinking while eating: no Having you been chewing well: yes Chewing/swallowing difficulties: no Changes in vision: no Changes to mood/headaches: no Hair loss/Changes to skin/Changes to nails: yes-states this is normal for her when she has surgery, no, no Any difficulty focusing or concentrating: no Sweating: no Dizziness/Lightheaded: no Palpitations: no  Carbonated beverages: no N/V/D/C/GAS: sometimes when she wakes up or lies down, no, sometimes, no, no Abdominal Pain: once, lower right abdominal pain Dumping syndrome: no Last Lap-Band fill: N/A  Recent physical activity:  ADLs, takes the long way to restroom, walking inside home several times  Progress Towards Goal(s):  In progress.  Handouts given during visit include:  none   Nutritional Diagnosis:  Inadequate fluid intake As related to bariatric surgery post-op recommendations.  As evidenced by pt report of less than 64 fluid ounces a day.    Intervention:  Nutrition education and counseling. Pt was educated and counseled on the  importance of meeting fluid needs consistently and ways to mindfully eat to en Goals: - Keep protein snacks handy during conference, such as greek yogurt, protein shakes, and chicken packs.  - Continue to increase fluid intake.  Teaching Method Utilized:  Visual Auditory Hands on  Barriers to learning/adherence to lifestyle change: contemplative stage of  change  Demonstrated degree of understanding via:  Teach Back   Monitoring/Evaluation:  Dietary intake, exercise, lap band fills, and body weight. Follow up in 3 months for 8 month post-op visit.

## 2018-01-13 DIAGNOSIS — F3289 Other specified depressive episodes: Secondary | ICD-10-CM | POA: Diagnosis not present

## 2018-01-13 DIAGNOSIS — K210 Gastro-esophageal reflux disease with esophagitis: Secondary | ICD-10-CM | POA: Diagnosis not present

## 2018-01-13 DIAGNOSIS — I10 Essential (primary) hypertension: Secondary | ICD-10-CM | POA: Diagnosis not present

## 2018-01-13 DIAGNOSIS — E038 Other specified hypothyroidism: Secondary | ICD-10-CM | POA: Diagnosis not present

## 2018-01-13 DIAGNOSIS — J4520 Mild intermittent asthma, uncomplicated: Secondary | ICD-10-CM | POA: Diagnosis not present

## 2018-01-17 DIAGNOSIS — Z9884 Bariatric surgery status: Secondary | ICD-10-CM | POA: Diagnosis not present

## 2018-02-14 DIAGNOSIS — E038 Other specified hypothyroidism: Secondary | ICD-10-CM | POA: Diagnosis not present

## 2018-02-14 DIAGNOSIS — J4520 Mild intermittent asthma, uncomplicated: Secondary | ICD-10-CM | POA: Diagnosis not present

## 2018-02-14 DIAGNOSIS — K210 Gastro-esophageal reflux disease with esophagitis: Secondary | ICD-10-CM | POA: Diagnosis not present

## 2018-02-14 DIAGNOSIS — F3289 Other specified depressive episodes: Secondary | ICD-10-CM | POA: Diagnosis not present

## 2018-02-14 DIAGNOSIS — I10 Essential (primary) hypertension: Secondary | ICD-10-CM | POA: Diagnosis not present

## 2018-02-18 DIAGNOSIS — M160 Bilateral primary osteoarthritis of hip: Secondary | ICD-10-CM | POA: Diagnosis not present

## 2018-02-18 DIAGNOSIS — Z79899 Other long term (current) drug therapy: Secondary | ICD-10-CM | POA: Diagnosis not present

## 2018-02-18 DIAGNOSIS — Z6841 Body Mass Index (BMI) 40.0 and over, adult: Secondary | ICD-10-CM | POA: Diagnosis not present

## 2018-02-18 DIAGNOSIS — F3289 Other specified depressive episodes: Secondary | ICD-10-CM | POA: Diagnosis not present

## 2018-02-18 DIAGNOSIS — E038 Other specified hypothyroidism: Secondary | ICD-10-CM | POA: Diagnosis not present

## 2018-02-18 DIAGNOSIS — K210 Gastro-esophageal reflux disease with esophagitis: Secondary | ICD-10-CM | POA: Diagnosis not present

## 2018-02-18 DIAGNOSIS — I10 Essential (primary) hypertension: Secondary | ICD-10-CM | POA: Diagnosis not present

## 2018-02-18 DIAGNOSIS — J4520 Mild intermittent asthma, uncomplicated: Secondary | ICD-10-CM | POA: Diagnosis not present

## 2018-03-11 ENCOUNTER — Encounter: Payer: Self-pay | Admitting: Registered"

## 2018-03-11 ENCOUNTER — Encounter: Payer: Medicare HMO | Attending: Surgery | Admitting: Registered"

## 2018-03-11 DIAGNOSIS — F3289 Other specified depressive episodes: Secondary | ICD-10-CM | POA: Diagnosis not present

## 2018-03-11 DIAGNOSIS — Z6841 Body Mass Index (BMI) 40.0 and over, adult: Secondary | ICD-10-CM | POA: Diagnosis not present

## 2018-03-11 DIAGNOSIS — E669 Obesity, unspecified: Secondary | ICD-10-CM

## 2018-03-11 DIAGNOSIS — Z713 Dietary counseling and surveillance: Secondary | ICD-10-CM | POA: Insufficient documentation

## 2018-03-11 DIAGNOSIS — J4520 Mild intermittent asthma, uncomplicated: Secondary | ICD-10-CM | POA: Diagnosis not present

## 2018-03-11 DIAGNOSIS — K210 Gastro-esophageal reflux disease with esophagitis: Secondary | ICD-10-CM | POA: Diagnosis not present

## 2018-03-11 DIAGNOSIS — E038 Other specified hypothyroidism: Secondary | ICD-10-CM | POA: Diagnosis not present

## 2018-03-11 DIAGNOSIS — I10 Essential (primary) hypertension: Secondary | ICD-10-CM | POA: Diagnosis not present

## 2018-03-11 NOTE — Progress Notes (Signed)
Follow-up visit: 8 Months Post-Operative Sleeve Gastrectomy Surgery  Medical Nutrition Therapy:  Appt start time: 10:15 end time: 11:00  Primary concerns today: Post-operative Bariatric Surgery Nutrition Management.  Non scale victories: breathe better, can tolerate more protein options, using walker less often, taking steps instead of the ramp, reduced thyroid medication  Surgery date: 07/23/2017 Surgery type: Sleeve gastrectomy Start weight at [facility]: 335.0 Weight today: 241.0 Weight change: 12 lbs from 253 (pt reported on 12/09/2017) Total weight lost: 94 lbs loss  Weight loss goal: breathe, live, and lose weight and maintain it  TANITA  BODY COMP RESULTS  08/07/2017 09/17/2017 10/01/2017 03/11/2018   BMI (kg/m^2) 53.7 N/A N/A    Fat Mass (lbs) 149      Fat Free Mass (lbs) 130.4      Total Body Water (lbs) 72        Pt arrives with husband. Pt brings recent labs: abnormal values include cholesterol 221 and LDL cholesterol 156. Pt states she has new grandson who is 2.5 months old. Pt states she has been using her lipedema pump to prevent leg blisters from opening up. Pt states she is experiencing more reflux. Pt states she is not a drinker and averaging between 30-50 ounces/day.   Pt states she cannot tolerate green beans, lima beans, or fruit well. Pt states she had a small amount of ice cream + strawberries + banana + chocolate last night and it made her stomach upset.   Pt states she loves mac and cheese and stuffing.    Preferred Learning Style:   No preference indicated   Learning Readiness:   Contemplating  Ready  Change in progress  24-hr recall: B (AM): 2 eggs (12g), cheese (6g), cheese grits  Snk (AM): none  L (PM): 7 vienna sausages (9g), ritz crackers (1g), cheese (4g) Snk (PM): sometimes greek yogurt (15g) D (PM): 4 oz boiled chicken (28g) or 8 broiled shrimp (30g), 8 oysters (10g) or 6 oz baked bbq chicken (42g), 1/2 c pinto beans (8g) Snk (PM):  none  Fluid intake: coffee (10 oz), water, decaf tea (15 oz); ~30-50 ounces Estimated total protein intake: avgs 60+ grams   Medications: See list Supplementation: [employer] health chewable + Viactiv 3 x day + biotin + turmeric  Using straws: no Drinking while eating: no Having you been chewing well: yes Chewing/swallowing difficulties: no Changes in vision: no Changes to mood/headaches: no Hair loss/Changes to skin/Changes to nails: no, no, no Any difficulty focusing or concentrating: no Sweating: no Dizziness/Lightheaded: no Palpitations: no  Carbonated beverages: no N/V/D/C/GAS: no, no, sometimes, no, no Abdominal Pain: no Dumping syndrome: no Last Lap-Band fill: N/A  Recent physical activity:  ADLs, takes the long way to restroom, walking inside home several times, weight-bearing exercises 30 min, 7 days/week  Progress Towards Goal(s):  In progress.  Handouts given during visit include:  Phase IV: High protein + NS vegetables   Nutritional Diagnosis:  Inadequate fluid intake As related to bariatric surgery post-op recommendations.  As evidenced by pt report of less than 64 fluid ounces a day.    Intervention:  Nutrition education and counseling. Pt was educated and counseled on the importance of meeting fluid needs consistently and ways to mindfully eat to en Goals:  Follow Phase 3B: High Protein + Non-Starchy Vegetables  Eat 3-6 small meals/snacks, every 3-5 hrs  Increase lean protein foods to meet 60g goal  Increase fluid intake to 64oz +  Avoid drinking 15 minutes before, during and 30 minutes  after eating  Aim for >30 min of physical activity daily  - Keep fluids handy and sip throughout the day. Aim for at least 64 ounces a day. - Try TUMS at night.   - Try not to go to sleep within 2-3 hours of eating to help with reflux.  - Keep fat and sugar in single digits per serving on food labels.   Teaching Method Utilized:  Visual Auditory Hands  on  Barriers to learning/adherence to lifestyle change: contemplative stage of change  Demonstrated degree of understanding via:  Teach Back   Monitoring/Evaluation:  Dietary intake, exercise, lap band fills, and body weight. Follow up in 4 months for 12 month post-op visit.

## 2018-03-11 NOTE — Patient Instructions (Addendum)
Goals:  Follow Phase 3B: High Protein + Non-Starchy Vegetables  Eat 3-6 small meals/snacks, every 3-5 hrs  Increase lean protein foods to meet 60g goal  Increase fluid intake to 64oz +  Avoid drinking 15 minutes before, during and 30 minutes after eating  Aim for >30 min of physical activity daily  - Keep fluids handy and sip throughout the day. Aim for at least 64 ounces a day.  - Try TUMS at night.    - Try not to go to sleep within 2-3 hours of eating to help with reflux.   - Keep fat and sugar in single digits per serving on food labels.

## 2018-04-18 IMAGING — RF DG UGI W/ HIGH DENSITY W/KUB
8 of 11 series · 14 of 23 positions shown · non-contrast
Comparison: Chest radiographs 05/04/2016

CLINICAL DATA: 65-year-old female with morbid obesity, preoperative
study for bariatric surgery. Initial encounter.

EXAM:
UPPER GI SERIES WITH KUB
TECHNIQUE: After obtaining a scout radiograph a routine upper GI series was
performed using barium
FLUOROSCOPY TIME:  Fluoroscopy Time:  1 minutes 40 seconds
Radiation Exposure Index (if provided by the fluoroscopic device):
[id number]
Number of Acquired Spot Images: 0

[Series 1: t abdomen supine · 0.15mm/px · 1 of 1 slices shown]
[im 1/1]
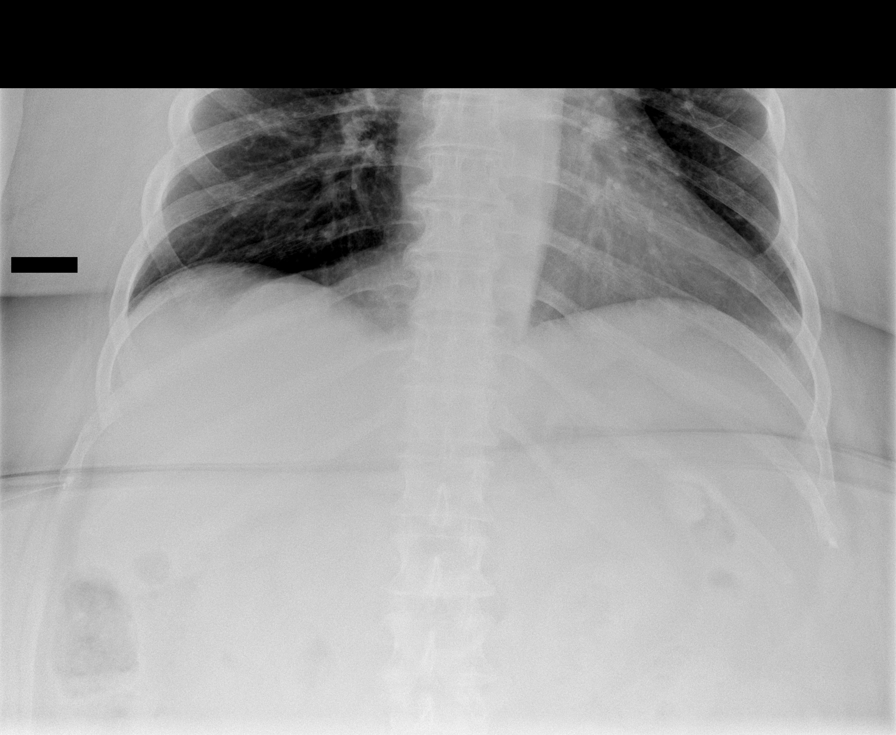

[Series 3: cp_standard · 0.26mm/px · 1 of 1 slices shown (1 of 7)]
[im 1/1]
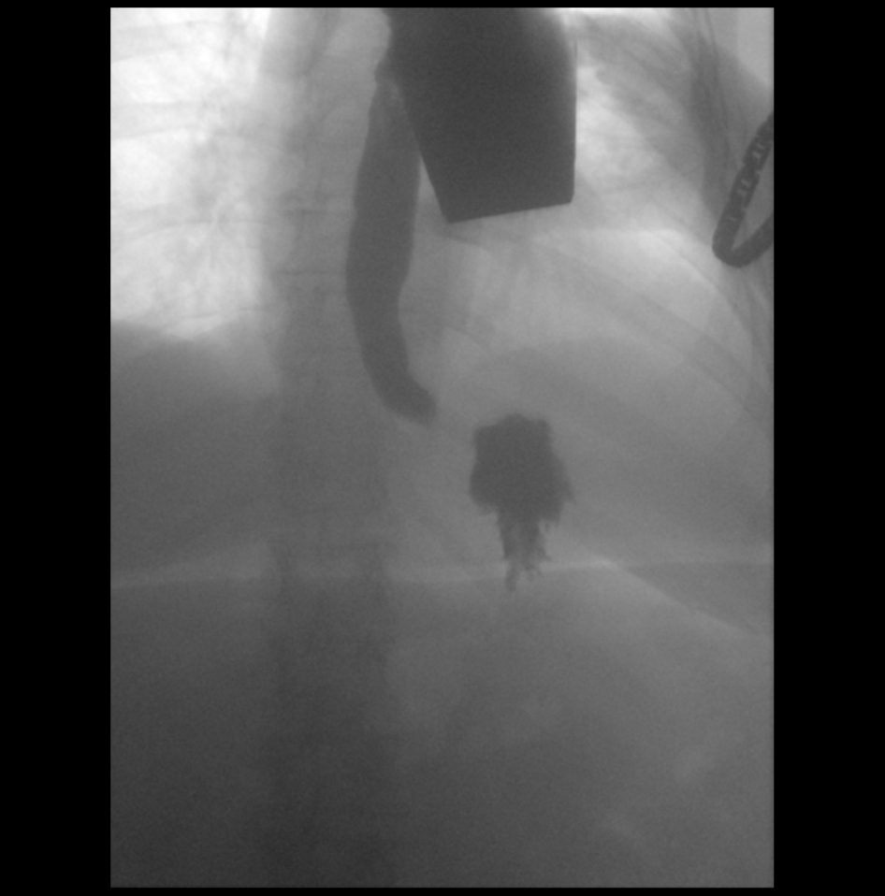

[Series 5: cp_standard · 0.52mm/px · 3 of 17 frames shown (2 of 7)]
[frame 3/17]
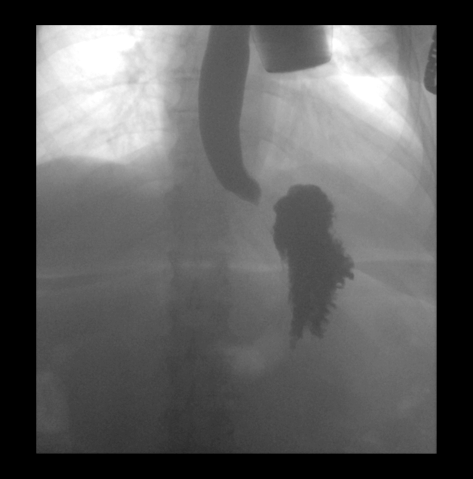
[frame 9/17]
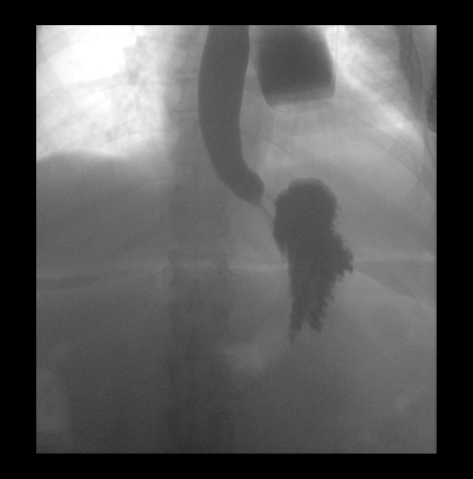
[frame 17/17]
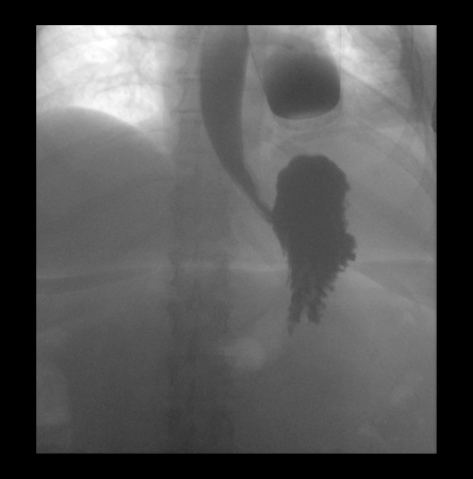

[Series 6: cp_standard · 0.52mm/px · 2 of 31 frames shown (3 of 7)]
[frame 16/31]
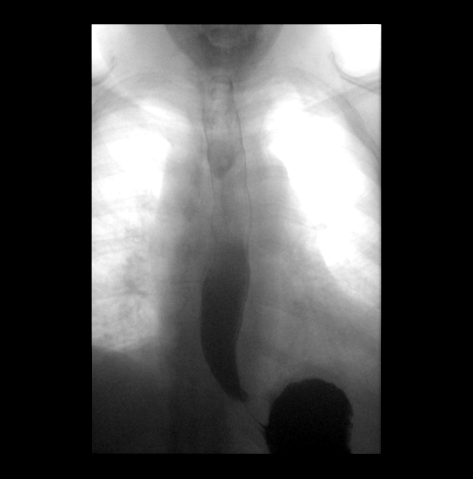
[frame 27/31]
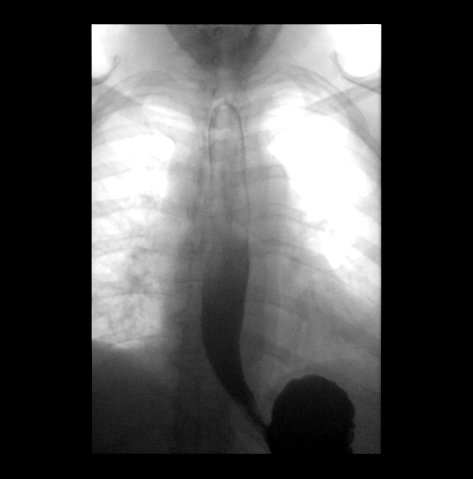

[Series 7: cp_standard · 0.50mm/px · 3 of 32 frames shown (4 of 7)]
[frame 3/32]
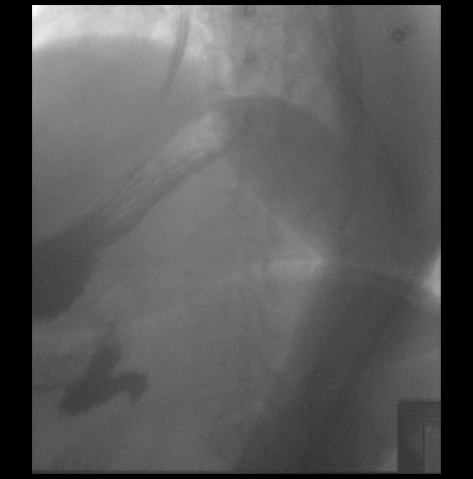
[frame 5/32]
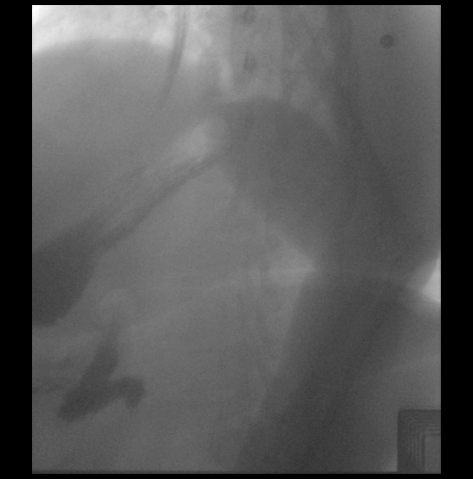
[frame 28/32]
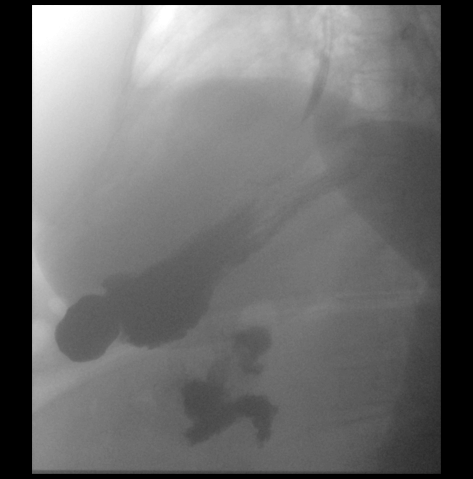

[Series 9: cp_standard · 0.25mm/px · 1 of 1 slices shown (5 of 7)]
[im 1/1]
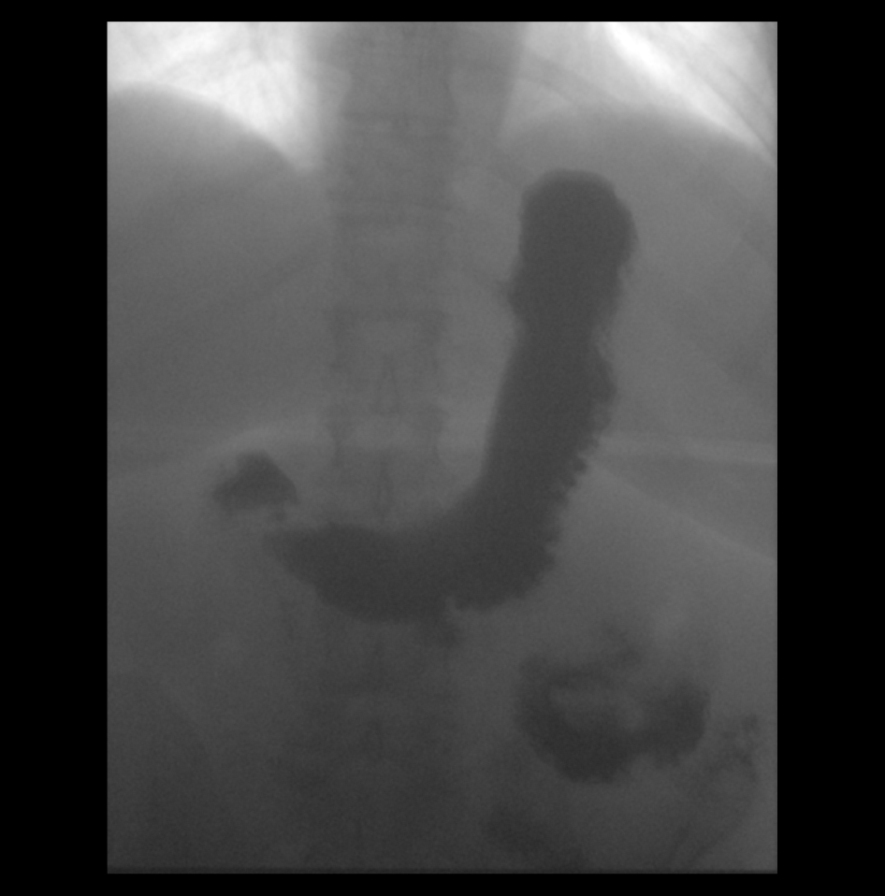

[Series 10: cp_standard · 0.51mm/px · 2 of 41 frames shown (6 of 7)]
[frame 7/41]
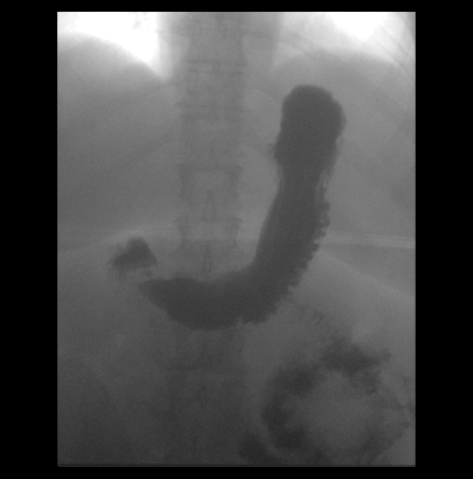
[frame 21/41]
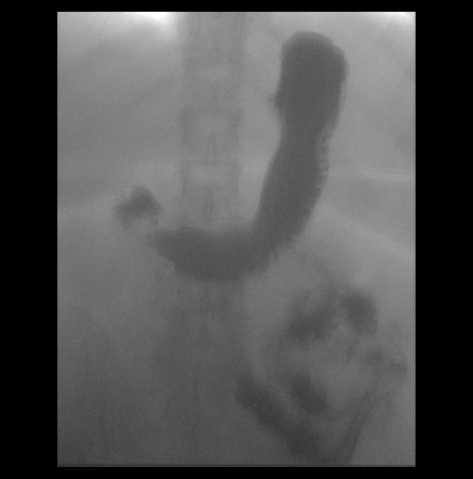

[Series 11: cp_standard · 0.25mm/px · 1 of 1 slices shown (7 of 7)]
[im 1/1]
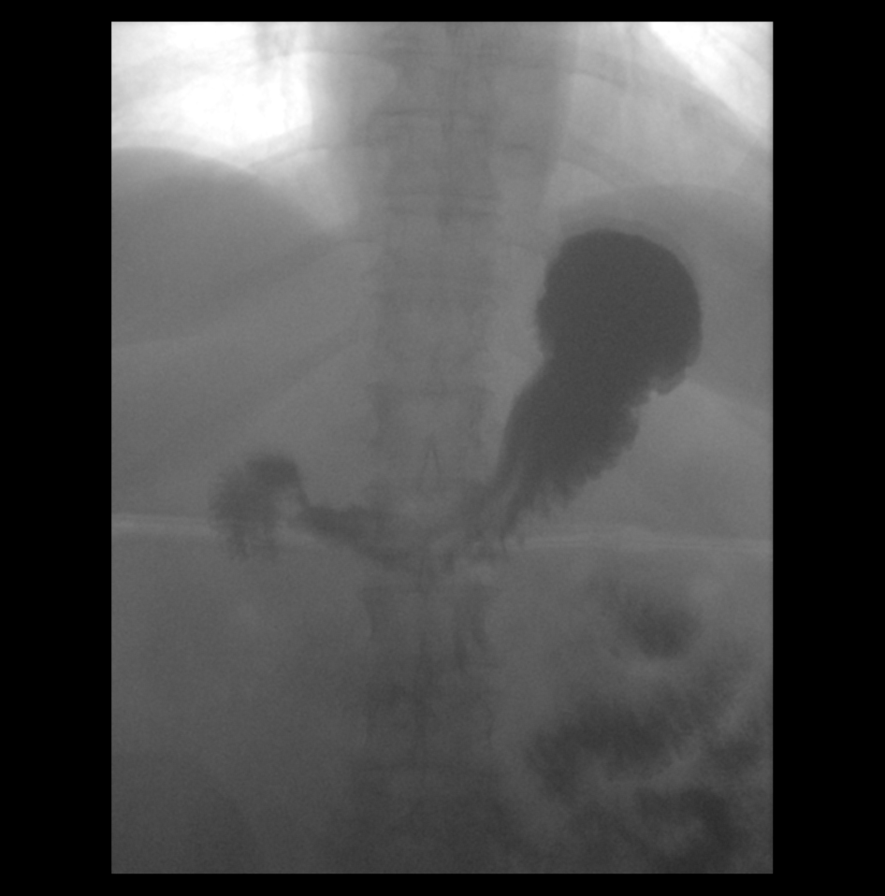

[14 of 23 positions shown; findings below may reference images not displayed]

FINDINGS: Preprocedural scout view of the abdomen and pelvis. Large body
habitus. Negative lung bases. Non obstructed bowel gas pattern. No
acute osseous abnormality identified.

A single contrast study was undertaken and despite limited mobility
the patient tolerated this well and without difficulty.

No obstruction to the forward flow of contrast throughout the
esophagus and into the stomach. Normal esophageal course and
contour.

Normal gastroesophageal junction. Normal gastric contour. Normal
visible gastric mucosal pattern.

When the patient was positioned right lateral decubitus there was
prompt gastric emptying to the duodenum. The duodenum course and
contour appear normal. Proximal small bowel loops are located in the
left abdomen and appear normal.

No gastroesophageal reflux occurred.
IMPRESSION: Normal single contrast upper GI study.

## 2018-05-13 DIAGNOSIS — F3289 Other specified depressive episodes: Secondary | ICD-10-CM | POA: Diagnosis not present

## 2018-05-13 DIAGNOSIS — J4520 Mild intermittent asthma, uncomplicated: Secondary | ICD-10-CM | POA: Diagnosis not present

## 2018-05-13 DIAGNOSIS — K210 Gastro-esophageal reflux disease with esophagitis: Secondary | ICD-10-CM | POA: Diagnosis not present

## 2018-05-13 DIAGNOSIS — I10 Essential (primary) hypertension: Secondary | ICD-10-CM | POA: Diagnosis not present

## 2018-05-13 DIAGNOSIS — E038 Other specified hypothyroidism: Secondary | ICD-10-CM | POA: Diagnosis not present

## 2018-05-27 DIAGNOSIS — Z0000 Encounter for general adult medical examination without abnormal findings: Secondary | ICD-10-CM | POA: Diagnosis not present

## 2018-05-27 DIAGNOSIS — Z6841 Body Mass Index (BMI) 40.0 and over, adult: Secondary | ICD-10-CM | POA: Diagnosis not present

## 2018-06-20 DIAGNOSIS — Z1231 Encounter for screening mammogram for malignant neoplasm of breast: Secondary | ICD-10-CM | POA: Diagnosis not present

## 2018-06-26 DIAGNOSIS — K210 Gastro-esophageal reflux disease with esophagitis: Secondary | ICD-10-CM | POA: Diagnosis not present

## 2018-06-26 DIAGNOSIS — J449 Chronic obstructive pulmonary disease, unspecified: Secondary | ICD-10-CM | POA: Diagnosis not present

## 2018-06-26 DIAGNOSIS — I10 Essential (primary) hypertension: Secondary | ICD-10-CM | POA: Diagnosis not present

## 2018-06-26 DIAGNOSIS — E038 Other specified hypothyroidism: Secondary | ICD-10-CM | POA: Diagnosis not present

## 2018-06-30 DIAGNOSIS — M810 Age-related osteoporosis without current pathological fracture: Secondary | ICD-10-CM | POA: Diagnosis not present

## 2018-06-30 DIAGNOSIS — E2839 Other primary ovarian failure: Secondary | ICD-10-CM | POA: Diagnosis not present

## 2018-07-22 ENCOUNTER — Encounter: Payer: Medicare HMO | Attending: Surgery | Admitting: Registered"

## 2018-07-22 DIAGNOSIS — Z6841 Body Mass Index (BMI) 40.0 and over, adult: Secondary | ICD-10-CM | POA: Insufficient documentation

## 2018-07-22 DIAGNOSIS — Z9884 Bariatric surgery status: Secondary | ICD-10-CM | POA: Insufficient documentation

## 2018-07-22 DIAGNOSIS — E669 Obesity, unspecified: Secondary | ICD-10-CM

## 2018-07-22 DIAGNOSIS — H04123 Dry eye syndrome of bilateral lacrimal glands: Secondary | ICD-10-CM | POA: Diagnosis not present

## 2018-07-22 DIAGNOSIS — H40013 Open angle with borderline findings, low risk, bilateral: Secondary | ICD-10-CM | POA: Diagnosis not present

## 2018-07-22 DIAGNOSIS — Z713 Dietary counseling and surveillance: Secondary | ICD-10-CM | POA: Diagnosis not present

## 2018-07-22 DIAGNOSIS — Z961 Presence of intraocular lens: Secondary | ICD-10-CM | POA: Diagnosis not present

## 2018-07-22 NOTE — Patient Instructions (Addendum)
-   Look into attending support group. See handout.   - Keep up the great work! You are doing phenomenal!

## 2018-07-22 NOTE — Progress Notes (Signed)
Follow-up visit: 12 Months Post-Operative Sleeve Gastrectomy Surgery  Medical Nutrition Therapy:  Appt start time: 8:55 end time: 9:35  Primary concerns today: Post-operative Bariatric Surgery Nutrition Management.  Non scale victories: breathe better, can tolerate more protein options, no longer using walker, taking steps instead of the ramp, reduced thyroid medication, no longer taking asthma medications, able to do more around the house  Surgery date: 07/23/2017 Surgery type: Sleeve gastrectomy Start weight at [facility]: 335.0 Weight today: 229.0 Weight change: 12 lbs from 241 (03/11/2018) Total weight lost: 106 lbs  Weight loss goal: breathe, live, lose weight and maintain it  TANITA  BODY COMP RESULTS  08/07/2017 09/17/2017 10/01/2017 07/22/2018   BMI (kg/m^2) 53.7 N/A N/A 44.0   Fat Mass (lbs) 149   110.6   Fat Free Mass (lbs) 130.4   118.4   Total Body Water (lbs) 96   85.4    Pt arrives with husband. Pt states reflux has become better; no longer taking protonix. Pt states she takes TUMS nightly to prevent reflux. Pt states she has been doing well and overall glad to be more mobile without her walker. Pt states she has been very intentional about walking around more at home, church, and when shopping. Pt states she no longer uses the electric cart when shopping but will walk using the buggy.   Pt states she has new great grandson who is 5 months old. Pt states she is not a drinker and averaging between 30-50 ounces/day. Pt states she loves mac and cheese and stuffing.    Preferred Learning Style:   No preference indicated   Learning Readiness:   Contemplating  Ready  Change in progress  24-hr recall: B (AM): 1 eggs (6g), cheese (6g), cheese grits  Snk (AM): none  L (PM): beef burrito (no rice, chips, or wrap 13g)  Snk (PM): sometimes cheese puffs D (PM): 5 oz cheeseburger (35g) + slaw + chili Snk (PM): none  Fluid intake: decaf coffee (20 oz), water, decaf tea (24  oz), sometimes unsweet/sweet tea; 50+ ounces Estimated total protein intake: avgs 60+ grams   Medications: See list; no longer taking asthma medications, protonix,  Supplementation: [employer] health chewable + Viactiv 3 x day + biotin + turmeric  Using straws: no Drinking while eating: no Having you been chewing well: yes Chewing/swallowing difficulties: no Changes in vision: no Changes to mood/headaches: no Hair loss/Changes to skin/Changes to nails: no, no, no Any difficulty focusing or concentrating: no Sweating: no Dizziness/Lightheaded: no Palpitations: no  Carbonated beverages: no N/V/D/C/GAS: no, no, sometimes due to IBS, no, no Abdominal Pain: no Dumping syndrome: no Last Lap-Band fill: N/A  Recent physical activity:  Walking at home, push cart at [employer], weight-bearing exercises 30 min, 7 days/week  Progress Towards Goal(s):  In progress.  Handouts given during visit include:  Phase VII: Maintenance Phase   Nutritional Diagnosis:  Inadequate fluid intake As related to bariatric surgery post-op recommendations.  As evidenced by pt report of less than 64 fluid ounces a day.    Intervention:  Nutrition education and counseling. Pt was encouraged to keep up with the great work with habits already established and attending support group. Pt was in agreement with goals listed.  Goals: - Look into attending support group. See handout.  - Keep up the great work! You are doing phenomenal!  Teaching Method Utilized:  Visual Auditory Hands on  Barriers to learning/adherence to lifestyle change: contemplative stage of change  Demonstrated degree of understanding via:  Teach Back   Monitoring/Evaluation:  Dietary intake, exercise, lap band fills, and body weight. Follow up prn.

## 2018-07-23 ENCOUNTER — Encounter: Payer: Self-pay | Admitting: Registered"

## 2018-07-29 DIAGNOSIS — Z0100 Encounter for examination of eyes and vision without abnormal findings: Secondary | ICD-10-CM | POA: Diagnosis not present

## 2018-07-30 DIAGNOSIS — Z09 Encounter for follow-up examination after completed treatment for conditions other than malignant neoplasm: Secondary | ICD-10-CM | POA: Diagnosis not present

## 2018-09-03 DIAGNOSIS — Z6841 Body Mass Index (BMI) 40.0 and over, adult: Secondary | ICD-10-CM | POA: Diagnosis not present

## 2018-09-03 DIAGNOSIS — E038 Other specified hypothyroidism: Secondary | ICD-10-CM | POA: Diagnosis not present

## 2018-09-03 DIAGNOSIS — Z79899 Other long term (current) drug therapy: Secondary | ICD-10-CM | POA: Diagnosis not present

## 2018-09-03 DIAGNOSIS — K210 Gastro-esophageal reflux disease with esophagitis: Secondary | ICD-10-CM | POA: Diagnosis not present

## 2018-09-03 DIAGNOSIS — Z9884 Bariatric surgery status: Secondary | ICD-10-CM | POA: Diagnosis not present

## 2018-09-03 DIAGNOSIS — I10 Essential (primary) hypertension: Secondary | ICD-10-CM | POA: Diagnosis not present

## 2018-09-09 ENCOUNTER — Encounter (INDEPENDENT_AMBULATORY_CARE_PROVIDER_SITE_OTHER): Payer: Self-pay | Admitting: *Deleted

## 2018-09-09 DIAGNOSIS — K210 Gastro-esophageal reflux disease with esophagitis: Secondary | ICD-10-CM | POA: Diagnosis not present

## 2018-09-09 DIAGNOSIS — E038 Other specified hypothyroidism: Secondary | ICD-10-CM | POA: Diagnosis not present

## 2018-09-09 DIAGNOSIS — I10 Essential (primary) hypertension: Secondary | ICD-10-CM | POA: Diagnosis not present

## 2018-10-15 DIAGNOSIS — E038 Other specified hypothyroidism: Secondary | ICD-10-CM | POA: Diagnosis not present

## 2018-10-15 DIAGNOSIS — K210 Gastro-esophageal reflux disease with esophagitis: Secondary | ICD-10-CM | POA: Diagnosis not present

## 2018-10-15 DIAGNOSIS — I10 Essential (primary) hypertension: Secondary | ICD-10-CM | POA: Diagnosis not present

## 2018-10-17 ENCOUNTER — Ambulatory Visit (INDEPENDENT_AMBULATORY_CARE_PROVIDER_SITE_OTHER): Payer: Self-pay | Admitting: Internal Medicine

## 2018-11-04 ENCOUNTER — Encounter (INDEPENDENT_AMBULATORY_CARE_PROVIDER_SITE_OTHER): Payer: Self-pay | Admitting: *Deleted

## 2018-11-04 ENCOUNTER — Encounter (INDEPENDENT_AMBULATORY_CARE_PROVIDER_SITE_OTHER): Payer: Self-pay | Admitting: Internal Medicine

## 2018-11-04 ENCOUNTER — Telehealth (INDEPENDENT_AMBULATORY_CARE_PROVIDER_SITE_OTHER): Payer: Self-pay | Admitting: *Deleted

## 2018-11-04 ENCOUNTER — Ambulatory Visit (INDEPENDENT_AMBULATORY_CARE_PROVIDER_SITE_OTHER): Payer: Medicare HMO | Admitting: Internal Medicine

## 2018-11-04 VITALS — BP 142/84 | HR 64 | Temp 97.6°F | Ht 59.0 in | Wt 227.6 lb

## 2018-11-04 DIAGNOSIS — R1319 Other dysphagia: Secondary | ICD-10-CM

## 2018-11-04 DIAGNOSIS — Z86010 Personal history of colonic polyps: Secondary | ICD-10-CM | POA: Insufficient documentation

## 2018-11-04 DIAGNOSIS — Z9884 Bariatric surgery status: Secondary | ICD-10-CM | POA: Diagnosis not present

## 2018-11-04 DIAGNOSIS — R1310 Dysphagia, unspecified: Secondary | ICD-10-CM | POA: Diagnosis not present

## 2018-11-04 MED ORDER — SOD PHOS MONO-SOD PHOS DIBASIC 1.102-0.398 G PO TABS: 1.0000 | ORAL_TABLET | Freq: Once | ORAL | 0 refills | Status: AC

## 2018-11-04 NOTE — Patient Instructions (Signed)
The risks of bleeding, perforation and infection were reviewed with patient.  

## 2018-11-04 NOTE — Progress Notes (Signed)
Subjective:    Patient ID: Annette Smith, female    DOB: 01/06/1951, 67 y.o.   MRN: [id number]  HPI Referred by Dr. Olena LeatherwoodHasanaj to discuss TCS/Colonoscipy. Per records, has a hx of colon polyps. Her last colonoscopy was in 2014 (Personal hx of colon polyps) Dr. Teena DunkBenson:  Moderate diverticulosis in transverse,descending, and sigmoid colon.  No colonic or rectal polyps.  Next colonoscopy in 5 yrs.  She tells me she has diarrhea with hx of IBS. She has had IBS for years.  No melena or BRRB.  She also tells me she has a narrowing in her esophagus. She is having trouble swallowing pills and sometimes just water. Meats bother her if they are tough. No GERD. She takes 2 Tums a night before hs.  She is not hard to sedate per patient.   No family hx of colon cancer.  07/23/2017 Gastric sleeve surgery. Has lost about 100 pounds since her surgery.  Review of Systems Past Medical History:  Diagnosis Date  . Asthma   . Baker's cyst 2007  . Bulging lumbar disc   . Hypertension   . Phlebitis 1970s   bilateral LE   . Pneumonia   . PONV (postoperative nausea and vomiting)     Past Surgical History:  Procedure Laterality Date  . APPENDECTOMY    . BRAIN TUMOR EXCISION  1995  . BREAST BIOPSY    . CHOLECYSTECTOMY    . JOINT REPLACEMENT     LTKA 2010; RTKA 2003  . LAPAROSCOPIC GASTRIC SLEEVE RESECTION WITH HIATAL HERNIA REPAIR N/A 07/23/2017   Procedure: LAPAROSCOPIC GASTRIC SLEEVE RESECTION WITH HIATAL HERNIA REPAIR AND UPPER ENDOSCOPY;  Surgeon: Luretha MurphyMartin, Matthew, MD;  Location: [facility];  Service: General;  Laterality: N/A;  . TUBAL LIGATION      Allergies  Allergen Reactions  . Aspartame And Phenylalanine Hives  . Barbiturates Rash    Donnatal - severe rash  . Clindamycin/Lincomycin Nausea And Vomiting and Other (See Comments)    "Deadly flu-like symptoms."  Can't take any MYCINS  . Diphtheria Toxoid-Containing Vaccines Other (See Comments)    Pt is unsure of reaction  . Erythromycin Nausea And  Vomiting and Other (See Comments)    "Deadly flu-like symptoms; fever, too."  Was told never to take "ANY MYCINS."  . Other Nausea And Vomiting and Other (See Comments)    Pt states all MYCINS cause severe "flu-like symptoms, fever;" was told never to take ANY MYCINS.  . Adhesive [Tape] Dermatitis    "Blisters, bruising with (sugical) tape; have to use paper tape."  . Ibuprofen Other (See Comments)    "knots in stomach that draw me double"  . Pb-Hyoscy-Atropine-Scopolamine Itching    "skin crawling"  . Tetanus Toxoids Other (See Comments)    Arm swells and red streaks  . Gabapentin Other (See Comments)    Unknown  . Imdur [Isosorbide Nitrate]   . Isosorbide Nausea And Vomiting    Shortness of breath  . Tramadol Other (See Comments)    hallucinations    Current Outpatient Medications on File Prior to Visit  Medication Sig Dispense Refill  . acetaminophen (TYLENOL) 500 MG tablet Take 1,000 mg by mouth every 8 (eight) hours as needed for mild pain or headache.    . albuterol (PROVENTIL HFA;VENTOLIN HFA) 108 (90 Base) MCG/ACT inhaler Inhale 2 puffs into the lungs every 6 (six) hours as needed for wheezing or shortness of breath.    . allopurinol (ZYLOPRIM) 300 MG tablet Take 300 mg  by mouth daily with breakfast.     . ARTIFICIAL TEAR OP Place 2 drops into both eyes 3 (three) times daily as needed (dry eyes).    . ASPERCREME W/LIDOCAINE EX Apply topically as needed.    . benazepril (LOTENSIN) 10 MG tablet Take 10 mg by mouth daily with breakfast.     . Biotin 1 MG CAPS Take 1 mg by mouth daily with lunch.     . Calcium Carbonate-Vitamin D (CALCIUM 600+D) 600-200 MG-UNIT TABS Take 1 tablet by mouth daily with lunch.    . Carboxymethylcell-Hypromellose (GENTEAL OP) Place 1 application into both eyes at bedtime. Must be preservative free    . diazepam (VALIUM) 5 MG tablet Take 2.5 mg by mouth daily as needed for anxiety.     . dicyclomine (BENTYL) 20 MG tablet Take 20 mg by mouth daily  before breakfast.     . HYDROcodone-acetaminophen (NORCO) 7.5-325 MG tablet Take 1 tablet by mouth every 6 (six) hours as needed for moderate pain.    Marland Kitchen. levocetirizine (XYZAL) 5 MG tablet Take 5 mg by mouth as needed.     Marland Kitchen. levothyroxine (SYNTHROID, LEVOTHROID) 100 MCG tablet Take 88 mcg by mouth daily before breakfast.     . loratadine (CLARITIN) 10 MG tablet Take 10 mg by mouth daily with breakfast.     . Multiple Vitamin (MULTIVITAMIN WITH MINERALS) TABS tablet Take 1 tablet by mouth See admin instructions. Takes 1 at 11AM and 1 at 2pm.    . OVER THE COUNTER MEDICATION Viactiv calcium plus D three times a day    . pantoprazole (PROTONIX) 40 MG tablet Take 40 mg by mouth daily.    . promethazine (PHENERGAN) 12.5 MG tablet Take 1 tablet (12.5 mg total) by mouth every 6 (six) hours as needed for nausea or vomiting. 30 tablet 0  . sertraline (ZOLOFT) 50 MG tablet Take 25 mg by mouth at bedtime.     Marland Kitchen. spironolactone (ALDACTONE) 25 MG tablet Take 50 mg by mouth daily with breakfast.     . TURMERIC PO Take by mouth daily.     No current facility-administered medications on file prior to visit.         Objective:   Physical Exam Blood pressure (!) 142/84, pulse 64, temperature 97.6 F (36.4 C), height 4\' 11"  (1.499 m), weight 227 lb 9.6 oz (103.2 kg), unknown if currently breastfeeding. Alert and oriented. Skin warm and dry. Oral mucosa is moist.   . Sclera anicteric, conjunctivae is pink. Thyroid not enlarged. No cervical lymphadenopathy. Lungs clear. Heart regular rate and rhythm.  Abdomen is soft. Bowel sounds are positive. No hepatomegaly. No abdominal masses felt. No tenderness.  No edema to lower extremities.           Assessment & Plan:  Dysphagia: Needs EGD/ED. Hx of narrow surgery. Colon polyps: Needs surveillance. Last colonoscopy was in 2014 by Dr. Teena DunkBenson.  No polyps.

## 2018-11-04 NOTE — Telephone Encounter (Signed)
Patient needs osmo pill 

## 2018-11-06 ENCOUNTER — Encounter (INDEPENDENT_AMBULATORY_CARE_PROVIDER_SITE_OTHER): Payer: Self-pay | Admitting: *Deleted

## 2018-11-06 ENCOUNTER — Telehealth (INDEPENDENT_AMBULATORY_CARE_PROVIDER_SITE_OTHER): Payer: Self-pay | Admitting: *Deleted

## 2018-11-06 MED ORDER — SUPREP BOWEL PREP KIT 17.5-3.13-1.6 GM/177ML PO SOLN: 1.0000 | Freq: Once | ORAL | 0 refills | Status: AC

## 2018-11-06 NOTE — Addendum Note (Signed)
Addended by: Len BlalockSETZER, Adream Parzych L on: 11/06/2018 10:22 AM   Modules accepted: Orders

## 2018-11-06 NOTE — Telephone Encounter (Signed)
Patient states pantoprazole was on her AVS from her visit Tuesday and she doesn't take it anymore

## 2018-11-06 NOTE — Telephone Encounter (Signed)
Patient needs suprep 

## 2018-11-06 NOTE — Telephone Encounter (Signed)
Corrected

## 2018-11-17 DIAGNOSIS — I10 Essential (primary) hypertension: Secondary | ICD-10-CM | POA: Diagnosis not present

## 2018-11-17 DIAGNOSIS — E038 Other specified hypothyroidism: Secondary | ICD-10-CM | POA: Diagnosis not present

## 2018-11-17 DIAGNOSIS — K210 Gastro-esophageal reflux disease with esophagitis: Secondary | ICD-10-CM | POA: Diagnosis not present

## 2018-11-19 ENCOUNTER — Telehealth (INDEPENDENT_AMBULATORY_CARE_PROVIDER_SITE_OTHER): Payer: Self-pay | Admitting: Internal Medicine

## 2018-11-19 NOTE — Telephone Encounter (Signed)
Patient called stated she has two medications for eczema to add to her medicaiton list - triamcinolone and acetonide cream prescribed by Dr Olena Leatherwood

## 2018-11-19 NOTE — Telephone Encounter (Signed)
I will add these at next OV

## 2018-11-26 DIAGNOSIS — M109 Gout, unspecified: Secondary | ICD-10-CM | POA: Diagnosis not present

## 2018-11-26 DIAGNOSIS — Z6841 Body Mass Index (BMI) 40.0 and over, adult: Secondary | ICD-10-CM | POA: Diagnosis not present

## 2018-11-26 DIAGNOSIS — K210 Gastro-esophageal reflux disease with esophagitis: Secondary | ICD-10-CM | POA: Diagnosis not present

## 2018-11-26 DIAGNOSIS — E038 Other specified hypothyroidism: Secondary | ICD-10-CM | POA: Diagnosis not present

## 2018-11-26 DIAGNOSIS — Z1389 Encounter for screening for other disorder: Secondary | ICD-10-CM | POA: Diagnosis not present

## 2018-11-26 DIAGNOSIS — I10 Essential (primary) hypertension: Secondary | ICD-10-CM | POA: Diagnosis not present

## 2018-11-26 DIAGNOSIS — Z0000 Encounter for general adult medical examination without abnormal findings: Secondary | ICD-10-CM | POA: Diagnosis not present

## 2018-12-04 ENCOUNTER — Encounter (HOSPITAL_COMMUNITY)
Admission: RE | Disposition: A | Discharge status: Home / Self Care | Payer: Self-pay | Source: Ambulatory Visit | Attending: Internal Medicine

## 2018-12-04 ENCOUNTER — Encounter (HOSPITAL_COMMUNITY): Payer: Self-pay | Admitting: *Deleted

## 2018-12-04 ENCOUNTER — Other Ambulatory Visit: Payer: Self-pay

## 2018-12-04 ENCOUNTER — Ambulatory Visit (HOSPITAL_COMMUNITY)
Admission: RE | Admit: 2018-12-04 | Discharge: 2018-12-04 | Disposition: A | Discharge status: Home / Self Care | Payer: Medicare HMO | Source: Ambulatory Visit | Attending: Internal Medicine | Admitting: Internal Medicine

## 2018-12-04 DIAGNOSIS — R1319 Other dysphagia: Secondary | ICD-10-CM

## 2018-12-04 DIAGNOSIS — R1314 Dysphagia, pharyngoesophageal phase: Secondary | ICD-10-CM | POA: Diagnosis not present

## 2018-12-04 DIAGNOSIS — K219 Gastro-esophageal reflux disease without esophagitis: Secondary | ICD-10-CM | POA: Insufficient documentation

## 2018-12-04 DIAGNOSIS — K5730 Diverticulosis of large intestine without perforation or abscess without bleeding: Secondary | ICD-10-CM | POA: Diagnosis not present

## 2018-12-04 DIAGNOSIS — Z91048 Other nonmedicinal substance allergy status: Secondary | ICD-10-CM | POA: Diagnosis not present

## 2018-12-04 DIAGNOSIS — Z86718 Personal history of other venous thrombosis and embolism: Secondary | ICD-10-CM | POA: Insufficient documentation

## 2018-12-04 DIAGNOSIS — Z800 Family history of malignant neoplasm of digestive organs: Secondary | ICD-10-CM | POA: Insufficient documentation

## 2018-12-04 DIAGNOSIS — Z881 Allergy status to other antibiotic agents status: Secondary | ICD-10-CM | POA: Insufficient documentation

## 2018-12-04 DIAGNOSIS — Z887 Allergy status to serum and vaccine status: Secondary | ICD-10-CM | POA: Diagnosis not present

## 2018-12-04 DIAGNOSIS — Z885 Allergy status to narcotic agent status: Secondary | ICD-10-CM | POA: Diagnosis not present

## 2018-12-04 DIAGNOSIS — Z79899 Other long term (current) drug therapy: Secondary | ICD-10-CM | POA: Diagnosis not present

## 2018-12-04 DIAGNOSIS — Z1211 Encounter for screening for malignant neoplasm of colon: Secondary | ICD-10-CM | POA: Insufficient documentation

## 2018-12-04 DIAGNOSIS — K644 Residual hemorrhoidal skin tags: Secondary | ICD-10-CM | POA: Insufficient documentation

## 2018-12-04 DIAGNOSIS — Z09 Encounter for follow-up examination after completed treatment for conditions other than malignant neoplasm: Secondary | ICD-10-CM | POA: Diagnosis not present

## 2018-12-04 DIAGNOSIS — Z9884 Bariatric surgery status: Secondary | ICD-10-CM | POA: Diagnosis not present

## 2018-12-04 DIAGNOSIS — Z7951 Long term (current) use of inhaled steroids: Secondary | ICD-10-CM | POA: Diagnosis not present

## 2018-12-04 DIAGNOSIS — J45909 Unspecified asthma, uncomplicated: Secondary | ICD-10-CM | POA: Insufficient documentation

## 2018-12-04 DIAGNOSIS — Z9049 Acquired absence of other specified parts of digestive tract: Secondary | ICD-10-CM | POA: Insufficient documentation

## 2018-12-04 DIAGNOSIS — Z825 Family history of asthma and other chronic lower respiratory diseases: Secondary | ICD-10-CM | POA: Diagnosis not present

## 2018-12-04 DIAGNOSIS — I10 Essential (primary) hypertension: Secondary | ICD-10-CM | POA: Diagnosis not present

## 2018-12-04 DIAGNOSIS — Z833 Family history of diabetes mellitus: Secondary | ICD-10-CM | POA: Diagnosis not present

## 2018-12-04 DIAGNOSIS — Z886 Allergy status to analgesic agent status: Secondary | ICD-10-CM | POA: Insufficient documentation

## 2018-12-04 DIAGNOSIS — Z86010 Personal history of colonic polyps: Secondary | ICD-10-CM | POA: Insufficient documentation

## 2018-12-04 DIAGNOSIS — Z96653 Presence of artificial knee joint, bilateral: Secondary | ICD-10-CM | POA: Insufficient documentation

## 2018-12-04 DIAGNOSIS — Z8249 Family history of ischemic heart disease and other diseases of the circulatory system: Secondary | ICD-10-CM | POA: Insufficient documentation

## 2018-12-04 DIAGNOSIS — Z888 Allergy status to other drugs, medicaments and biological substances status: Secondary | ICD-10-CM | POA: Diagnosis not present

## 2018-12-04 DIAGNOSIS — M5126 Other intervertebral disc displacement, lumbar region: Secondary | ICD-10-CM | POA: Insufficient documentation

## 2018-12-04 DIAGNOSIS — R1310 Dysphagia, unspecified: Secondary | ICD-10-CM

## 2018-12-04 HISTORY — PX: ESOPHAGOGASTRODUODENOSCOPY: SHX5428

## 2018-12-04 HISTORY — PX: ESOPHAGEAL DILATION: SHX303

## 2018-12-04 HISTORY — PX: COLONOSCOPY: SHX5424

## 2018-12-04 SURGERY — COLONOSCOPY
Anesthesia: Moderate Sedation

## 2018-12-04 MED ORDER — SODIUM CHLORIDE 0.9 % IV SOLN
INTRAVENOUS | Status: DC
  Administered 2018-12-04: 12:00:00 via INTRAVENOUS

## 2018-12-04 MED ORDER — LIDOCAINE VISCOUS HCL 2 % MT SOLN
OROMUCOSAL | Status: DC | PRN
  Administered 2018-12-04: 1 via OROMUCOSAL

## 2018-12-04 MED ORDER — MIDAZOLAM HCL 5 MG/5ML IJ SOLN
INTRAMUSCULAR | Status: AC
  Filled 2018-12-04: qty 10

## 2018-12-04 MED ORDER — MEPERIDINE HCL 50 MG/ML IJ SOLN
INTRAMUSCULAR | Status: DC | PRN
  Administered 2018-12-04 (×3): 25 mg via INTRAVENOUS

## 2018-12-04 MED ORDER — MEPERIDINE HCL 50 MG/ML IJ SOLN
INTRAMUSCULAR | Status: AC
  Filled 2018-12-04: qty 1

## 2018-12-04 MED ORDER — LIDOCAINE VISCOUS HCL 2 % MT SOLN
OROMUCOSAL | Status: AC
  Filled 2018-12-04: qty 15

## 2018-12-04 MED ORDER — MIDAZOLAM HCL 5 MG/5ML IJ SOLN
INTRAMUSCULAR | Status: DC | PRN
  Administered 2018-12-04 (×2): 1 mg via INTRAVENOUS
  Administered 2018-12-04 (×4): 2 mg via INTRAVENOUS

## 2018-12-04 NOTE — Op Note (Signed)
[facility] Patient Name: Annette Smith Procedure Date: 12/04/2018 11:47 AM MRN: [id number] Date of Birth: 1951/08/29 Attending MD: Lionel December , MD CSN: [id number] Age: 68 Admit Type: Outpatient Procedure:                Upper GI endoscopy Indications:              Esophageal dysphagia Providers:                Lionel December, MD, Jannett Celestine, RN, Edythe Clarity,                            Technician Referring MD:             Lia Hopping, MD Medicines:                Lidocaine spray, Meperidine 50 mg IV, Midazolam 10                            mg IV Complications:            No immediate complications. Estimated Blood Loss:     Estimated blood loss: none. Procedure:                Pre-Anesthesia Assessment:                           - Prior to the procedure, a History and Physical                            was performed, and patient medications and                            allergies were reviewed. The patient's tolerance of                            previous anesthesia was also reviewed. The risks                            and benefits of the procedure and the sedation                            options and risks were discussed with the patient.                            All questions were answered, and informed consent                            was obtained. Prior Anticoagulants: The patient has                            taken no previous anticoagulant or antiplatelet                            agents. ASA Grade Assessment: III - A patient with  severe systemic disease. After reviewing the risks                            and benefits, the patient was deemed in                            satisfactory condition to undergo the procedure.                           After obtaining informed consent, the endoscope was                            passed under direct vision. Throughout the                            procedure, the patient's blood pressure,  pulse, and                            oxygen saturations were monitored continuously. The                            GIF-[id number] ([id number] scope was introduced through the                            mouth, and advanced to the second part of duodenum.                            The upper GI endoscopy was accomplished without                            difficulty. The patient tolerated the procedure                            well. Scope In: 12:08:04 PM Scope Out: 12:19:10 PM Total Procedure Duration: 0 hours 11 minutes 6 seconds  Findings:      The examined esophagus was normal.      The Z-line was regular and was found 37 cm from the incisors.      No endoscopic abnormality was evident in the esophagus to explain the       patient's complaint of dysphagia. It was decided, however, to proceed       with dilation of the entire esophagus. The scope was withdrawn. Dilation       was performed with a Maloney dilator with no resistance at 54 Fr. The       dilation site was examined following endoscope reinsertion and showed no       change and no bleeding, mucosal tear or perforation.      Evidence of a sleeve gastrectomy was found in the gastric fundus and in       the gastric body. This was characterized by healthy appearing mucosa.      The exam of the stomach was otherwise normal.      The duodenal bulb and second portion of the duodenum were normal. Impression:               - Normal esophagus.                           -  Z-line regular, 37 cm from the incisors.                           - No endoscopic esophageal abnormality to explain                            patient's dysphagia. Esophagus dilated. Dilated.                           - A sleeve gastrectomy was found, characterized by                            healthy appearing mucosa.                           - Normal duodenal bulb and second portion of the                            duodenum.                           - No specimens  collected. Moderate Sedation:      Moderate (conscious) sedation was administered by the endoscopy nurse       and supervised by the endoscopist. The following parameters were       monitored: oxygen saturation, heart rate, blood pressure, CO2       capnography and response to care. Total physician intraservice time was       19 minutes. Recommendation:           - Patient has a contact number available for                            emergencies. The signs and symptoms of potential                            delayed complications were discussed with the                            patient. Return to normal activities tomorrow.                            Written discharge instructions were provided to the                            patient.                           - Resume previous diet today.                           - Continue present medications.                           - Telephone GI clinic in 1 week. Procedure Code(s):        --- Professional ---  28366, Esophagogastroduodenoscopy, flexible,                            transoral; diagnostic, including collection of                            specimen(s) by brushing or washing, when performed                            (separate procedure)                           43450, Dilation of esophagus, by unguided sound or                            bougie, single or multiple passes                           G0500, Moderate sedation services provided by the                            same physician or other qualified health care                            professional performing a gastrointestinal                            endoscopic service that sedation supports,                            requiring the presence of an independent trained                            observer to assist in the monitoring of the                            patient's level of consciousness and physiological                             status; initial 15 minutes of intra-service time;                            patient age 24 years or older (additional time may                            be reported with 29476, as appropriate) Diagnosis Code(s):        --- Professional ---                           Z98.84, Bariatric surgery status                           R13.14, Dysphagia, pharyngoesophageal phase CPT copyright 2018 [employer]. All rights reserved. The codes documented in this report are preliminary and upon coder review may  be revised to meet current  compliance requirements. Lionel December, MD Lionel December, MD 12/04/2018 12:52:31 PM This report has been signed electronically. Number of Addenda: 0

## 2018-12-04 NOTE — Discharge Instructions (Signed)
Resume usual medications as before. High-fiber diet. No driving for 24 hours. Next colonoscopy in 7 years. Please call office with progress report in 1 week regarding swallowing difficulty.    Colonoscopy, Adult, Care After This sheet gives you information about how to care for yourself after your procedure. Your doctor may also give you more specific instructions. If you have problems or questions, call your doctor. What can I expect after the procedure? After the procedure, it is common to have:  A small amount of blood in your poop for 24 hours.  Some gas.  Mild cramping or bloating in your belly. Follow these instructions at home: General instructions  For the first 24 hours after the procedure: ? Do not drive or use machinery. ? Do not sign important documents. ? Do not drink alcohol. ? Do your daily activities more slowly than normal. ? Eat foods that are soft and easy to digest.  Take over-the-counter or prescription medicines only as told by your doctor. To help cramping and bloating:   Try walking around.  Put heat on your belly (abdomen) as told by your doctor. Use a heat source that your doctor recommends, such as a moist heat pack or a heating pad. ? Put a towel between your skin and the heat source. ? Leave the heat on for 20-30 minutes. ? Remove the heat if your skin turns bright red. This is especially important if you cannot feel pain, heat, or cold. You can get burned. Eating and drinking   Drink enough fluid to keep your pee (urine) clear or pale yellow.  Return to your normal diet as told by your doctor. Avoid heavy or fried foods that are hard to digest.  Avoid drinking alcohol for as long as told by your doctor. Contact a doctor if:  You have blood in your poop (stool) 2-3 days after the procedure. Get help right away if:  You have more than a small amount of blood in your poop.  You see large clumps of tissue (blood clots) in your  poop.  Your belly is swollen.  You feel sick to your stomach (nauseous).  You throw up (vomit).  You have a fever.  You have belly pain that gets worse, and medicine does not help your pain. Summary  After the procedure, it is common to have a small amount of blood in your poop. You may also have mild cramping and bloating in your belly.  For the first 24 hours after the procedure, do not drive or use machinery, do not sign important documents, and do not drink alcohol.  Get help right away if you have a lot of blood in your poop, feel sick to your stomach, have a fever, or have more belly pain. This information is not intended to replace advice given to you by your health care provider. Make sure you discuss any questions you have with your health care provider. Document Released: 11/24/2010 Document Revised: 08/22/2017 Document Reviewed: 07/16/2016 Elsevier Interactive Patient Education  2019 [employer]   Hemorrhoids Hemorrhoids are swollen veins that may develop:  In the butt (rectum). These are called internal hemorrhoids.  Around the opening of the butt (anus). These are called external hemorrhoids. Hemorrhoids can cause pain, itching, or bleeding. Most of the time, they do not cause serious problems. They usually get better with diet changes, lifestyle changes, and other home treatments. What are the causes? This condition may be caused by:  Having trouble pooping (constipation).  Pushing  hard (straining) to poop.  Watery poop (diarrhea).  Pregnancy.  Being very overweight (obese).  Sitting for long periods of time.  Heavy lifting or other activity that causes you to strain.  Anal sex.  Riding a bike for a long period of time. What are the signs or symptoms? Symptoms of this condition include:  Pain.  Itching or soreness in the butt.  Bleeding from the butt.  Leaking poop.  Swelling in the area.  One or more lumps around the opening of your  butt. How is this diagnosed? A doctor can often diagnose this condition by looking at the affected area. The doctor may also:  Do an exam that involves feeling the area with a gloved hand (digital rectal exam).  Examine the area inside your butt using a small tube (anoscope).  Order blood tests. This may be done if you have lost a lot of blood.  Have you get a test that involves looking inside the colon using a flexible tube with a camera on the end (sigmoidoscopy or colonoscopy). How is this treated? This condition can usually be treated at home. Your doctor may tell you to change what you eat, make lifestyle changes, or try home treatments. If these do not help, procedures can be done to remove the hemorrhoids or make them smaller. These may involve:  Placing rubber bands at the base of the hemorrhoids to cut off their blood supply.  Injecting medicine into the hemorrhoids to shrink them.  Shining a type of light energy onto the hemorrhoids to cause them to fall off.  Doing surgery to remove the hemorrhoids or cut off their blood supply. Follow these instructions at home: Eating and drinking   Eat foods that have a lot of fiber in them. These include whole grains, beans, nuts, fruits, and vegetables.  Ask your doctor about taking products that have added fiber (fibersupplements).  Reduce the amount of fat in your diet. You can do this by: ? Eating low-fat dairy products. ? Eating less red meat. ? Avoiding processed foods.  Drink enough fluid to keep your pee (urine) pale yellow. Managing pain and swelling   Take a warm-water bath (sitz bath) for 20 minutes to ease pain. Do this 3-4 times a day. You may do this in a bathtub or using a portable sitz bath that fits over the toilet.  If told, put ice on the painful area. It may be helpful to use ice between your warm baths. ? Put ice in a plastic bag. ? Place a towel between your skin and the bag. ? Leave the ice on for 20  minutes, 2-3 times a day. General instructions  Take over-the-counter and prescription medicines only as told by your doctor. ? Medicated creams and medicines may be used as told.  Exercise often. Ask your doctor how much and what kind of exercise is best for you.  Go to the bathroom when you have the urge to poop. Do not wait.  Avoid pushing too hard when you poop.  Keep your butt dry and clean. Use wet toilet paper or moist towelettes after pooping.  Do not sit on the toilet for a long time.  Keep all follow-up visits as told by your doctor. This is important. Contact a doctor if you:  Have pain and swelling that do not get better with treatment or medicine.  Have trouble pooping.  Cannot poop.  Have pain or swelling outside the area of the hemorrhoids. Get help right  away if you have:  Bleeding that will not stop. Summary  Hemorrhoids are swollen veins in the butt or around the opening of the butt.  They can cause pain, itching, or bleeding.  Eat foods that have a lot of fiber in them. These include whole grains, beans, nuts, fruits, and vegetables.  Take a warm-water bath (sitz bath) for 20 minutes to ease pain. Do this 3-4 times a day. This information is not intended to replace advice given to you by your health care provider. Make sure you discuss any questions you have with your health care provider. Document Released: 07/31/2008 Document Revised: 03/13/2018 Document Reviewed: 03/13/2018 Elsevier Interactive Patient Education  2019 [employer].   Diverticulosis  Diverticulosis is a condition that develops when small pouches (diverticula) form in the wall of the large intestine (colon). The colon is where water is absorbed and stool is formed. The pouches form when the inside layer of the colon pushes through weak spots in the outer layers of the colon. You may have a few pouches or many of them. What are the causes? The cause of this condition is not  known. What increases the risk? The following factors may make you more likely to develop this condition:  Being older than age 25. Your risk for this condition increases with age. Diverticulosis is rare among people younger than age 73. By age 34, many people have it.  Eating a low-fiber diet.  Having frequent constipation.  Being overweight.  Not getting enough exercise.  Smoking.  Taking over-the-counter pain medicines, like aspirin and ibuprofen.  Having a family history of diverticulosis. What are the signs or symptoms? In most people, there are no symptoms of this condition. If you do have symptoms, they may include:  Bloating.  Cramps in the abdomen.  Constipation or diarrhea.  Pain in the lower left side of the abdomen. How is this diagnosed? This condition is most often diagnosed during an exam for other colon problems. Because diverticulosis usually has no symptoms, it often cannot be diagnosed independently. This condition may be diagnosed by:  Using a flexible scope to examine the colon (colonoscopy).  Taking an X-ray of the colon after dye has been put into the colon (barium enema).  Doing a CT scan. How is this treated? You may not need treatment for this condition if you have never developed an infection related to diverticulosis. If you have had an infection before, treatment may include:  Eating a high-fiber diet. This may include eating more fruits, vegetables, and grains.  Taking a fiber supplement.  Taking a live bacteria supplement (probiotic).  Taking medicine to relax your colon.  Taking antibiotic medicines. Follow these instructions at home:  Drink 6-8 glasses of water or more each day to prevent constipation.  Try not to strain when you have a bowel movement.  If you have had an infection before: ? Eat more fiber as directed by your health care provider or your diet and nutrition specialist (dietitian). ? Take a fiber supplement or  probiotic, if your health care provider approves.  Take over-the-counter and prescription medicines only as told by your health care provider.  If you were prescribed an antibiotic, take it as told by your health care provider. Do not stop taking the antibiotic even if you start to feel better.  Keep all follow-up visits as told by your health care provider. This is important. Contact a health care provider if:  You have pain in your abdomen.  You have bloating.  You have cramps.  You have not had a bowel movement in 3 days. Get help right away if:  Your pain gets worse.  Your bloating becomes very bad.  You have a fever or chills, and your symptoms suddenly get worse.  You vomit.  You have bowel movements that are bloody or black.  You have bleeding from your rectum. Summary  Diverticulosis is a condition that develops when small pouches (diverticula) form in the wall of the large intestine (colon).  You may have a few pouches or many of them.  This condition is most often diagnosed during an exam for other colon problems.  If you have had an infection related to diverticulosis, treatment may include increasing the fiber in your diet, taking supplements, or taking medicines. This information is not intended to replace advice given to you by your health care provider. Make sure you discuss any questions you have with your health care provider. Document Released: 07/19/2004 Document Revised: 09/10/2016 Document Reviewed: 09/10/2016 Elsevier Interactive Patient Education  2019 [employer]

## 2018-12-04 NOTE — Op Note (Signed)
[facility] Patient Name: Annette Smith Procedure Date: 12/04/2018 12:19 PM MRN: [id number] Date of Birth: 02/24/1951 Attending MD: Lionel December , MD CSN: [id number] Age: 68 Admit Type: Outpatient Procedure:                Colonoscopy Indications:              High risk colon cancer surveillance: Personal                            history of colonic polyps Providers:                Lionel December, MD, Jannett Celestine, RN, Edythe Clarity,                            Technician Referring MD:             Lia Hopping, MD Medicines:                Meperidine 25 mg IV Complications:            No immediate complications                           . Estimated Blood Loss:     Estimated blood loss: none. Procedure:                Pre-Anesthesia Assessment:                           - Prior to the procedure, a History and Physical                            was performed, and patient medications and                            allergies were reviewed. The patient's tolerance of                            previous anesthesia was also reviewed. The risks                            and benefits of the procedure and the sedation                            options and risks were discussed with the patient.                            All questions were answered, and informed consent                            was obtained. Prior Anticoagulants: The patient has                            taken no previous anticoagulant or antiplatelet                            agents. ASA Grade Assessment: III - A  patient with                            severe systemic disease. After reviewing the risks                            and benefits, the patient was deemed in                            satisfactory condition to undergo the procedure.                           - Prior to the procedure, a History and Physical                            was performed, and patient medications and                            allergies  were reviewed. The patient's tolerance of                            previous anesthesia was also reviewed. The risks                            and benefits of the procedure and the sedation                            options and risks were discussed with the patient.                            All questions were answered, and informed consent                            was obtained. Prior Anticoagulants: The patient has                            taken no previous anticoagulant or antiplatelet                            agents. ASA Grade Assessment: III - A patient with                            severe systemic disease. After reviewing the risks                            and benefits, the patient was deemed in                            satisfactory condition to undergo the procedure.                           After obtaining informed consent, the colonoscope  was passed under direct vision. Throughout the                            procedure, the patient's blood pressure, pulse, and                            oxygen saturations were monitored continuously. The                            [id number]-[id number] ([id number] scope was introduced through                            the anus and advanced to the the cecum, identified                            by appendiceal orifice and ileocecal valve. The                            colonoscopy was performed without difficulty. The                            patient tolerated the procedure well. The quality                            of the bowel preparation was good. Scope In: 12:23:22 PM Scope Out: 12:38:32 PM Scope Withdrawal Time: 0 hours 8 minutes 20 seconds  Total Procedure Duration: 0 hours 15 minutes 10 seconds  Findings:      The perianal and digital rectal examinations were normal.      Multiple small and large-mouthed diverticula were found in the sigmoid       colon.      The exam was otherwise normal throughout the  examined colon.      External hemorrhoids were found during retroflexion. The hemorrhoids       were small. Impression:               - Diverticulosis in the sigmoid colon.                           - External hemorrhoids.                           - No specimens collected. Moderate Sedation:      Moderate (conscious) sedation was administered by the endoscopy nurse       and supervised by the endoscopist. The following parameters were       monitored: oxygen saturation, heart rate, blood pressure, CO2       capnography and response to care. Total physician intraservice time was       23 minutes. Recommendation:           - Patient has a contact number available for                            emergencies. The signs and symptoms of potential  delayed complications were discussed with the                            patient. Return to normal activities tomorrow.                            Written discharge instructions were provided to the                            patient.                           - High fiber diet today.                           - Continue present medications.                           - Repeat colonoscopy in 7 years for surveillance. Procedure Code(s):        --- Professional ---                           [phone], Colonoscopy, flexible; diagnostic, including                            collection of specimen(s) by brushing or washing,                            when performed (separate procedure)                           99153, Moderate sedation; each additional 15                            minutes intraservice time                           G0500, Moderate sedation services provided by the                            same physician or other qualified health care                            professional performing a gastrointestinal                            endoscopic service that sedation supports,                            requiring the  presence of an independent trained                            observer to assist in the monitoring of the                            patient's level of consciousness and physiological  status; initial 15 minutes of intra-service time;                            patient age 98 years or older (additional time may                            be reported with 69629, as appropriate) Diagnosis Code(s):        --- Professional ---                           Z86.010, Personal history of colonic polyps                           K64.4, Residual hemorrhoidal skin tags                           K57.30, Diverticulosis of large intestine without                            perforation or abscess without bleeding CPT copyright 2018 [employer]. All rights reserved. The codes documented in this report are preliminary and upon coder review may  be revised to meet current compliance requirements. Lionel December, MD Lionel December, MD 12/04/2018 12:56:26 PM This report has been signed electronically. Number of Addenda: 0

## 2018-12-04 NOTE — H&P (Signed)
Annette Smith is an 68 y.o. female.   Chief Complaint: Patient is here for EGD, possible EGD and colonoscopy. HPI: Patient is 68 year old Caucasian female who has a history of GERD.  Her GERD symptoms have improved since she had gastric sleeve surgery and has lost over 100 pounds.  Now she is taking 2 Tums at bedtime.  She is noted intermittent solid food dysphagia over the last few months.  She points to midsternal area as site of bolus obstruction.  She had esophageal dilation possibly over 10 years ago.  She also has a history of colonic adenomas and is undergoing surveillance colonoscopy.  She denies rectal bleeding or melena.  She tells me that she is not having any problems with asthma since losing her weight. Family history significant for colon carcinoma in first cousin on mother side who died shortly after diagnosis.  She she possibly was in her late 50s at the time of diagnosis.  Past Medical History:  Diagnosis Date  . Asthma   . Baker's cyst 2007  . Bulging lumbar disc   . DVT (deep venous thrombosis) (HCC)    both legs  . Hypertension   . Phlebitis 1970s   bilateral LE   . Pneumonia   . PONV (postoperative nausea and vomiting)     Past Surgical History:  Procedure Laterality Date  . APPENDECTOMY    . BRAIN TUMOR EXCISION  1995  . BREAST BIOPSY    . CHOLECYSTECTOMY    . JOINT REPLACEMENT     LTKA 2010; RTKA 2003  . LAPAROSCOPIC GASTRIC SLEEVE RESECTION WITH HIATAL HERNIA REPAIR N/A 07/23/2017   Procedure: LAPAROSCOPIC GASTRIC SLEEVE RESECTION WITH HIATAL HERNIA REPAIR AND UPPER ENDOSCOPY;  Surgeon: Luretha Murphy, MD;  Location: [facility];  Service: General;  Laterality: N/A;  . Removal of benign brain tumor    . TUBAL LIGATION      Family History  Problem Relation Age of Onset  . Asthma Other   . Cancer Other   . Hypertension Other   . Diabetes Other    Social History:  reports that she has never smoked. She has never used smokeless tobacco. She reports that she does  not drink alcohol or use drugs.  Allergies:  Allergies  Allergen Reactions  . Aspartame And Phenylalanine Hives  . Barbiturates Rash    Donnatal - severe rash  . Clindamycin/Lincomycin Nausea And Vomiting and Other (See Comments)    "Deadly flu-like symptoms."  Can't take any MYCINS  . Diphtheria Toxoid-Containing Vaccines Other (See Comments)    Pt is unsure of reaction  . Erythromycin Nausea And Vomiting and Other (See Comments)    "Deadly flu-like symptoms; fever, too."  Was told never to take "ANY MYCINS."  . Other Nausea And Vomiting and Other (See Comments)    Pt states all MYCINS cause severe "flu-like symptoms, fever;" was told never to take ANY MYCINS.  . Adhesive [Tape] Dermatitis    "Blisters, bruising with (sugical) tape; have to use paper tape."  . Ibuprofen Other (See Comments)    "knots in stomach that draw me double"  . Pb-Hyoscy-Atropine-Scopolamine Itching    "skin crawling"  . Tetanus Toxoids Other (See Comments)    Arm swells and red streaks  . Gabapentin Other (See Comments)    Lost balance, heart racing  . Isosorbide Nausea And Vomiting    Shortness of breath  . Tramadol Other (See Comments)    hallucinations    Medications Prior to Admission  Medication Sig Dispense Refill  . acetaminophen (TYLENOL) 500 MG tablet Take 1,000 mg by mouth every 8 (eight) hours as needed for mild pain or headache.    . allopurinol (ZYLOPRIM) 300 MG tablet Take 300 mg by mouth daily with breakfast.     . ASPERCREME W/LIDOCAINE EX Apply 1 application topically as needed (pain).     . benazepril (LOTENSIN) 10 MG tablet Take 10 mg by mouth daily with breakfast.     . BIOTIN PO Take 1 tablet by mouth daily with lunch.    . calcium carbonate (TUMS - DOSED IN MG ELEMENTAL CALCIUM) 500 MG chewable tablet Chew 2 tablets by mouth at bedtime.    . Calcium-Vitamin D-Vitamin K (VIACTIV CALCIUM PLUS D PO) Take 1 tablet by mouth 3 (three) times daily.    . Carboxymethylcellulose Sodium  (THERATEARS OP) Place 1 drop into both eyes 3 (three) times daily as needed (dry eyes).    . diazepam (VALIUM) 5 MG tablet Take 2.5 mg by mouth daily as needed for anxiety.     . dicyclomine (BENTYL) 20 MG tablet Take 20 mg by mouth daily before breakfast.     . HYDROcodone-acetaminophen (NORCO) 7.5-325 MG tablet Take 1 tablet by mouth daily as needed for moderate pain.     Marland Kitchen. levocetirizine (XYZAL) 5 MG tablet Take 5 mg by mouth at bedtime as needed (itching).     Marland Kitchen. levothyroxine (SYNTHROID, LEVOTHROID) 88 MCG tablet Take 88 mcg by mouth daily before breakfast.    . loratadine (CLARITIN) 10 MG tablet Take 10 mg by mouth daily with breakfast.     . Multiple Vitamin (MULTIVITAMIN WITH MINERALS) TABS tablet Take 1 tablet by mouth daily.     . sertraline (ZOLOFT) 50 MG tablet Take 25 mg by mouth at bedtime.     Marland Kitchen. spironolactone (ALDACTONE) 25 MG tablet Take 50 mg by mouth daily with breakfast.     . triamcinolone cream (KENALOG) 0.1 % Apply 1 application topically daily as needed (eczema).    . TURMERIC PO Take 1 capsule by mouth daily with lunch.     . White Petrolatum-Mineral Oil (LUBRICANT EYE NIGHTTIME) OINT Place 1 application into both eyes at bedtime. bausch and lomb lubricant eye ointment, PF    . albuterol (PROVENTIL HFA;VENTOLIN HFA) 108 (90 Base) MCG/ACT inhaler Inhale 2 puffs into the lungs every 6 (six) hours as needed for wheezing or shortness of breath.    . promethazine (PHENERGAN) 12.5 MG tablet Take 1 tablet (12.5 mg total) by mouth every 6 (six) hours as needed for nausea or vomiting. 30 tablet 0    No results found for this or any previous visit (from the past 48 hour(s)). No results found.  ROS  Blood pressure 119/61, pulse 70, temperature (!) 97.4 F (36.3 C), temperature source Oral, resp. rate 18, SpO2 100 %, unknown if currently breastfeeding. Physical Exam  Constitutional: She appears well-developed and well-nourished.  HENT:  Mouth/Throat: Oropharynx is clear and  moist.  Eyes: Conjunctivae are normal. No scleral icterus.  Neck: No thyromegaly present.  Cardiovascular: Normal rate, regular rhythm and normal heart sounds.  No murmur heard. Respiratory: Effort normal and breath sounds normal.  GI:  Abdomen is full.  She has long right subcostal scar.  She also has laparoscopy scars.  Abdomen is soft and nontender with organomegaly or masses.  Musculoskeletal:     Comments: She has nonpitting pretibial edema involving both legs.  Lymphadenopathy:    She has no cervical  adenopathy.  Neurological: She is alert.  Skin: Skin is warm and dry.     Assessment/Plan Esophageal dysphagia. History of colonic adenomas.   Esophagogastroduodenoscopy possible esophageal dilation and surveillance colonoscopy.  Lionel December, MD 12/04/2018, 11:52 AM

## 2018-12-09 ENCOUNTER — Encounter (HOSPITAL_COMMUNITY): Payer: Self-pay | Admitting: Internal Medicine

## 2018-12-11 ENCOUNTER — Telehealth (INDEPENDENT_AMBULATORY_CARE_PROVIDER_SITE_OTHER): Payer: Self-pay | Admitting: Internal Medicine

## 2018-12-11 NOTE — Telephone Encounter (Signed)
Patient called wanted you to know she is doing very good.

## 2018-12-15 DIAGNOSIS — I10 Essential (primary) hypertension: Secondary | ICD-10-CM | POA: Diagnosis not present

## 2018-12-15 DIAGNOSIS — E038 Other specified hypothyroidism: Secondary | ICD-10-CM | POA: Diagnosis not present

## 2018-12-15 DIAGNOSIS — K210 Gastro-esophageal reflux disease with esophagitis: Secondary | ICD-10-CM | POA: Diagnosis not present

## 2018-12-16 NOTE — Telephone Encounter (Signed)
Glad to hear that patient able to swallow better. Office visit on as-needed basis.

## 2019-01-06 DIAGNOSIS — K210 Gastro-esophageal reflux disease with esophagitis: Secondary | ICD-10-CM | POA: Diagnosis not present

## 2019-01-06 DIAGNOSIS — E038 Other specified hypothyroidism: Secondary | ICD-10-CM | POA: Diagnosis not present

## 2019-01-06 DIAGNOSIS — I10 Essential (primary) hypertension: Secondary | ICD-10-CM | POA: Diagnosis not present

## 2019-01-14 DIAGNOSIS — Z9884 Bariatric surgery status: Secondary | ICD-10-CM | POA: Diagnosis not present

## 2019-01-21 DIAGNOSIS — Z6841 Body Mass Index (BMI) 40.0 and over, adult: Secondary | ICD-10-CM | POA: Diagnosis not present

## 2019-01-21 DIAGNOSIS — H6692 Otitis media, unspecified, left ear: Secondary | ICD-10-CM | POA: Diagnosis not present

## 2019-02-05 DIAGNOSIS — E038 Other specified hypothyroidism: Secondary | ICD-10-CM | POA: Diagnosis not present

## 2019-02-05 DIAGNOSIS — I10 Essential (primary) hypertension: Secondary | ICD-10-CM | POA: Diagnosis not present

## 2019-02-05 DIAGNOSIS — J449 Chronic obstructive pulmonary disease, unspecified: Secondary | ICD-10-CM | POA: Diagnosis not present

## 2019-03-10 DIAGNOSIS — E7849 Other hyperlipidemia: Secondary | ICD-10-CM | POA: Diagnosis not present

## 2019-03-10 DIAGNOSIS — Z6841 Body Mass Index (BMI) 40.0 and over, adult: Secondary | ICD-10-CM | POA: Diagnosis not present

## 2019-03-10 DIAGNOSIS — I10 Essential (primary) hypertension: Secondary | ICD-10-CM | POA: Diagnosis not present

## 2019-03-10 DIAGNOSIS — K210 Gastro-esophageal reflux disease with esophagitis: Secondary | ICD-10-CM | POA: Diagnosis not present

## 2019-03-18 DIAGNOSIS — E7849 Other hyperlipidemia: Secondary | ICD-10-CM | POA: Diagnosis not present

## 2019-03-18 DIAGNOSIS — I10 Essential (primary) hypertension: Secondary | ICD-10-CM | POA: Diagnosis not present

## 2019-03-18 DIAGNOSIS — K210 Gastro-esophageal reflux disease with esophagitis: Secondary | ICD-10-CM | POA: Diagnosis not present

## 2019-04-17 DIAGNOSIS — Z96653 Presence of artificial knee joint, bilateral: Secondary | ICD-10-CM | POA: Diagnosis not present

## 2019-04-17 DIAGNOSIS — I10 Essential (primary) hypertension: Secondary | ICD-10-CM | POA: Diagnosis not present

## 2019-04-17 DIAGNOSIS — K210 Gastro-esophageal reflux disease with esophagitis: Secondary | ICD-10-CM | POA: Diagnosis not present

## 2019-04-17 DIAGNOSIS — E7849 Other hyperlipidemia: Secondary | ICD-10-CM | POA: Diagnosis not present

## 2019-04-30 DIAGNOSIS — M7672 Peroneal tendinitis, left leg: Secondary | ICD-10-CM | POA: Diagnosis not present

## 2019-04-30 DIAGNOSIS — M79672 Pain in left foot: Secondary | ICD-10-CM | POA: Diagnosis not present

## 2019-05-18 DIAGNOSIS — E7849 Other hyperlipidemia: Secondary | ICD-10-CM | POA: Diagnosis not present

## 2019-05-18 DIAGNOSIS — I10 Essential (primary) hypertension: Secondary | ICD-10-CM | POA: Diagnosis not present

## 2019-05-18 DIAGNOSIS — K210 Gastro-esophageal reflux disease with esophagitis: Secondary | ICD-10-CM | POA: Diagnosis not present

## 2019-06-07 DIAGNOSIS — Z9884 Bariatric surgery status: Secondary | ICD-10-CM | POA: Diagnosis not present

## 2019-06-07 DIAGNOSIS — J310 Chronic rhinitis: Secondary | ICD-10-CM | POA: Diagnosis not present

## 2019-06-07 DIAGNOSIS — R531 Weakness: Secondary | ICD-10-CM | POA: Diagnosis not present

## 2019-06-07 DIAGNOSIS — J45909 Unspecified asthma, uncomplicated: Secondary | ICD-10-CM | POA: Diagnosis not present

## 2019-06-07 DIAGNOSIS — B349 Viral infection, unspecified: Secondary | ICD-10-CM | POA: Diagnosis not present

## 2019-06-07 DIAGNOSIS — J189 Pneumonia, unspecified organism: Secondary | ICD-10-CM | POA: Diagnosis not present

## 2019-06-07 DIAGNOSIS — R05 Cough: Secondary | ICD-10-CM | POA: Diagnosis not present

## 2019-06-07 DIAGNOSIS — J90 Pleural effusion, not elsewhere classified: Secondary | ICD-10-CM | POA: Diagnosis not present

## 2019-06-07 DIAGNOSIS — M170 Bilateral primary osteoarthritis of knee: Secondary | ICD-10-CM | POA: Diagnosis not present

## 2019-06-07 DIAGNOSIS — R509 Fever, unspecified: Secondary | ICD-10-CM | POA: Diagnosis not present

## 2019-06-07 DIAGNOSIS — E039 Hypothyroidism, unspecified: Secondary | ICD-10-CM | POA: Diagnosis not present

## 2019-06-07 DIAGNOSIS — U071 COVID-19: Secondary | ICD-10-CM | POA: Diagnosis not present

## 2019-06-07 DIAGNOSIS — J329 Chronic sinusitis, unspecified: Secondary | ICD-10-CM | POA: Diagnosis not present

## 2019-06-07 DIAGNOSIS — M109 Gout, unspecified: Secondary | ICD-10-CM | POA: Diagnosis not present

## 2019-06-08 DIAGNOSIS — B349 Viral infection, unspecified: Secondary | ICD-10-CM | POA: Diagnosis not present

## 2019-06-08 DIAGNOSIS — U071 COVID-19: Secondary | ICD-10-CM | POA: Diagnosis not present

## 2019-06-08 DIAGNOSIS — R791 Abnormal coagulation profile: Secondary | ICD-10-CM | POA: Diagnosis not present

## 2019-06-08 DIAGNOSIS — E039 Hypothyroidism, unspecified: Secondary | ICD-10-CM | POA: Diagnosis not present

## 2019-06-08 DIAGNOSIS — M109 Gout, unspecified: Secondary | ICD-10-CM | POA: Diagnosis not present

## 2019-06-08 DIAGNOSIS — J329 Chronic sinusitis, unspecified: Secondary | ICD-10-CM | POA: Diagnosis not present

## 2019-06-10 DIAGNOSIS — U071 COVID-19: Secondary | ICD-10-CM | POA: Diagnosis not present

## 2019-06-10 DIAGNOSIS — J329 Chronic sinusitis, unspecified: Secondary | ICD-10-CM | POA: Diagnosis not present

## 2019-06-10 DIAGNOSIS — E039 Hypothyroidism, unspecified: Secondary | ICD-10-CM | POA: Diagnosis not present

## 2019-06-10 DIAGNOSIS — B349 Viral infection, unspecified: Secondary | ICD-10-CM | POA: Diagnosis not present

## 2019-06-10 DIAGNOSIS — M109 Gout, unspecified: Secondary | ICD-10-CM | POA: Diagnosis not present

## 2019-06-16 DIAGNOSIS — R0602 Shortness of breath: Secondary | ICD-10-CM | POA: Diagnosis not present

## 2019-06-16 DIAGNOSIS — J189 Pneumonia, unspecified organism: Secondary | ICD-10-CM | POA: Diagnosis not present

## 2019-06-16 DIAGNOSIS — R7989 Other specified abnormal findings of blood chemistry: Secondary | ICD-10-CM | POA: Diagnosis not present

## 2019-06-16 DIAGNOSIS — Z6841 Body Mass Index (BMI) 40.0 and over, adult: Secondary | ICD-10-CM | POA: Diagnosis not present

## 2019-06-16 DIAGNOSIS — Z20828 Contact with and (suspected) exposure to other viral communicable diseases: Secondary | ICD-10-CM | POA: Diagnosis not present

## 2019-06-16 DIAGNOSIS — J9601 Acute respiratory failure with hypoxia: Secondary | ICD-10-CM | POA: Diagnosis not present

## 2019-06-16 DIAGNOSIS — J310 Chronic rhinitis: Secondary | ICD-10-CM | POA: Diagnosis not present

## 2019-06-16 DIAGNOSIS — M170 Bilateral primary osteoarthritis of knee: Secondary | ICD-10-CM | POA: Diagnosis not present

## 2019-06-16 DIAGNOSIS — J90 Pleural effusion, not elsewhere classified: Secondary | ICD-10-CM | POA: Diagnosis not present

## 2019-06-16 DIAGNOSIS — M109 Gout, unspecified: Secondary | ICD-10-CM | POA: Diagnosis not present

## 2019-06-16 DIAGNOSIS — J1289 Other viral pneumonia: Secondary | ICD-10-CM | POA: Diagnosis not present

## 2019-06-16 DIAGNOSIS — R918 Other nonspecific abnormal finding of lung field: Secondary | ICD-10-CM | POA: Diagnosis not present

## 2019-06-16 DIAGNOSIS — U071 COVID-19: Secondary | ICD-10-CM | POA: Diagnosis not present

## 2019-06-16 DIAGNOSIS — E039 Hypothyroidism, unspecified: Secondary | ICD-10-CM | POA: Diagnosis not present

## 2019-06-16 DIAGNOSIS — R509 Fever, unspecified: Secondary | ICD-10-CM | POA: Diagnosis not present

## 2019-06-17 DIAGNOSIS — Z6841 Body Mass Index (BMI) 40.0 and over, adult: Secondary | ICD-10-CM | POA: Diagnosis not present

## 2019-06-17 DIAGNOSIS — M109 Gout, unspecified: Secondary | ICD-10-CM | POA: Diagnosis not present

## 2019-06-17 DIAGNOSIS — U071 COVID-19: Secondary | ICD-10-CM | POA: Diagnosis not present

## 2019-06-17 DIAGNOSIS — J1289 Other viral pneumonia: Secondary | ICD-10-CM | POA: Diagnosis not present

## 2019-06-17 DIAGNOSIS — J9601 Acute respiratory failure with hypoxia: Secondary | ICD-10-CM | POA: Diagnosis not present

## 2019-06-18 DIAGNOSIS — R918 Other nonspecific abnormal finding of lung field: Secondary | ICD-10-CM | POA: Diagnosis not present

## 2019-06-18 DIAGNOSIS — J90 Pleural effusion, not elsewhere classified: Secondary | ICD-10-CM | POA: Diagnosis not present

## 2019-06-20 DIAGNOSIS — Z20828 Contact with and (suspected) exposure to other viral communicable diseases: Secondary | ICD-10-CM | POA: Diagnosis not present

## 2019-06-20 DIAGNOSIS — R7989 Other specified abnormal findings of blood chemistry: Secondary | ICD-10-CM | POA: Diagnosis not present

## 2019-06-21 DIAGNOSIS — J189 Pneumonia, unspecified organism: Secondary | ICD-10-CM | POA: Diagnosis not present

## 2019-06-21 DIAGNOSIS — R918 Other nonspecific abnormal finding of lung field: Secondary | ICD-10-CM | POA: Diagnosis not present

## 2019-06-22 DIAGNOSIS — J9601 Acute respiratory failure with hypoxia: Secondary | ICD-10-CM | POA: Diagnosis not present

## 2019-06-22 DIAGNOSIS — U071 COVID-19: Secondary | ICD-10-CM | POA: Diagnosis not present

## 2019-06-22 DIAGNOSIS — J1289 Other viral pneumonia: Secondary | ICD-10-CM | POA: Diagnosis not present

## 2019-06-22 DIAGNOSIS — M109 Gout, unspecified: Secondary | ICD-10-CM | POA: Diagnosis not present

## 2019-06-22 DIAGNOSIS — Z6841 Body Mass Index (BMI) 40.0 and over, adult: Secondary | ICD-10-CM | POA: Diagnosis not present

## 2019-06-29 DIAGNOSIS — Z6841 Body Mass Index (BMI) 40.0 and over, adult: Secondary | ICD-10-CM | POA: Diagnosis not present

## 2019-06-29 DIAGNOSIS — J1281 Pneumonia due to SARS-associated coronavirus: Secondary | ICD-10-CM | POA: Diagnosis not present

## 2019-06-30 DIAGNOSIS — Z20828 Contact with and (suspected) exposure to other viral communicable diseases: Secondary | ICD-10-CM | POA: Diagnosis not present

## 2019-07-14 ENCOUNTER — Ambulatory Visit: Payer: Medicare HMO | Admitting: Dietician

## 2019-07-27 DIAGNOSIS — I10 Essential (primary) hypertension: Secondary | ICD-10-CM | POA: Diagnosis not present

## 2019-07-27 DIAGNOSIS — E038 Other specified hypothyroidism: Secondary | ICD-10-CM | POA: Diagnosis not present

## 2019-07-27 DIAGNOSIS — J449 Chronic obstructive pulmonary disease, unspecified: Secondary | ICD-10-CM | POA: Diagnosis not present

## 2019-08-18 ENCOUNTER — Ambulatory Visit: Payer: Medicare HMO | Admitting: Dietician

## 2019-08-24 DIAGNOSIS — I10 Essential (primary) hypertension: Secondary | ICD-10-CM | POA: Diagnosis not present

## 2019-08-24 DIAGNOSIS — E038 Other specified hypothyroidism: Secondary | ICD-10-CM | POA: Diagnosis not present

## 2019-08-24 DIAGNOSIS — J449 Chronic obstructive pulmonary disease, unspecified: Secondary | ICD-10-CM | POA: Diagnosis not present

## 2019-09-01 ENCOUNTER — Other Ambulatory Visit: Payer: Self-pay

## 2019-09-01 ENCOUNTER — Encounter: Payer: Medicare HMO | Attending: Internal Medicine | Admitting: Dietician

## 2019-09-01 ENCOUNTER — Encounter: Payer: Self-pay | Admitting: Dietician

## 2019-09-01 DIAGNOSIS — Z9884 Bariatric surgery status: Secondary | ICD-10-CM | POA: Insufficient documentation

## 2019-09-01 NOTE — Patient Instructions (Signed)
-   Take Multivitamin and Calcium daily  - Stay physically active as much as possible - Drink at least 64 ounces of fluid daily (with at least 32 ounces being water)  - Eat at least 60 grams of protein daily

## 2019-09-01 NOTE — Progress Notes (Signed)
Follow-Up Nutrition Visit 2 Year Post-Op Bariatric Surgery Appt Start Time: 11:45am  End Time: 12:20pm  Surgery Type: Sleeve  Surgery Date: 07/23/2017  This visit was completed via telephone due to the COVID-19 pandemic.   I spoke with Annette Smith. Micheals and verified that I was speaking with the correct person with two patient identifiers (full name and date of birth).   I discussed the limitations related to this kind of visit and the patient is willing to proceed.  Primary concerns today: Post-Operative Bariatric Surgery Nutrition Management  Non scale victories: breathe better, can tolerate more protein options, no longer using walker, taking steps instead of the ramp, reduced thyroid medication, no longer taking asthma medications, able to do more around the house   NUTRITION ASSESSMENT   Start Weight at [facility]: 335 lbs Weight today: N/A Weight change: N/A  [place] Body Composition Scale  08/07/2017 09/17/2017 10/01/2017 07/22/2018   BMI (kg/m^2) 53.7 N/A N/A 44.0   Fat Mass (lbs) 149   110.6   Fat Free Mass (lbs) 130.4   118.4   Total Body Water (lbs) 96   85.4   Medical Hx: asthma, cancer, HTN, diabetes, DVT; NEW: COVID-19, pneumonia  Lifestyle & Dietary Hx Very close to her family. States she has been stressed out over the past few months dealing with COVID-19. States she has hit a stall in weight loss and has remained the same weight for past several weeks.   Likes slaw, green beans, seafood, cheese grits, bologna, BBQ, eggs, fried chicken sandwiches (w/o bread), chili beans, hot dog (w/o bun), steak, macaroni salad, mac & cheese. States she has not been "strict" on meeting her protein goal daily. Avoiding bread. States since she had COVID-19, she has had trouble getting back on track with protein.   24-Hr Dietary Recall First Meal: 1 egg + 1 pack cheese grits  Snack: -  Second Meal: 2 oz chopped BBQ   Snack: - Third Meal: 4 oz salmon Snack: - Beverages: water, tea  (sweetened w/ Splenda), black coffee  Estimated daily fluid intake: 50-64 ounces Estimated daily protein intake: 45-60 grams    Medications: see list  Supplementation: [employer] chewable + Viactiv 3 x day + biotin + turmeric  Using straws: no Drinking while eating: no Difficulty chewing: no Chewing/swallowing difficulties: no Changes in vision: no Changes to mood/headaches: no Hair loss/Changes to skin/Changes to nails: no Any difficulty focusing or concentrating: no Sweating: no Dizziness/Lightheaded: no Palpitations: no  Carbonated beverages: no N/V/D/C/GAS: no Abdominal Pain: no Dumping syndrome: no  Recent physical activity:  walking at home and in church parking lot, body weight & chair exercises    NUTRITION DIAGNOSIS Inadequate protein intake related to bariatric surgery as evidenced by patient reported diet history.    NUTRITION INTERVENTION Nutrition education and counseling.   Progress twards goal(s):  In progress  Handouts given during visit include:  Bariatric Plate   Bariatric Meal Ideas   Bariatric Snack Ideas  Handouts provided to pt via email [email or web address])   Barriers to learning/adherence to lifestyle change: contemplative stage of change  Demonstrated degree of understanding via: teach back     MONITORING / EVALUATION   Dietary intake, exercise, body weight, and goals at next nutrition visit.  Follow up PRN, pt states she will call to schedule next follow up visit.

## 2019-09-04 DIAGNOSIS — Z79899 Other long term (current) drug therapy: Secondary | ICD-10-CM | POA: Diagnosis not present

## 2019-09-04 DIAGNOSIS — E039 Hypothyroidism, unspecified: Secondary | ICD-10-CM | POA: Diagnosis not present

## 2019-09-04 DIAGNOSIS — Z9884 Bariatric surgery status: Secondary | ICD-10-CM | POA: Diagnosis not present

## 2019-09-04 DIAGNOSIS — R5383 Other fatigue: Secondary | ICD-10-CM | POA: Diagnosis not present

## 2019-09-22 DIAGNOSIS — I10 Essential (primary) hypertension: Secondary | ICD-10-CM | POA: Diagnosis not present

## 2019-09-22 DIAGNOSIS — E038 Other specified hypothyroidism: Secondary | ICD-10-CM | POA: Diagnosis not present

## 2019-09-22 DIAGNOSIS — J449 Chronic obstructive pulmonary disease, unspecified: Secondary | ICD-10-CM | POA: Diagnosis not present

## 2019-09-23 DIAGNOSIS — Z0000 Encounter for general adult medical examination without abnormal findings: Secondary | ICD-10-CM | POA: Diagnosis not present

## 2019-09-23 DIAGNOSIS — E038 Other specified hypothyroidism: Secondary | ICD-10-CM | POA: Diagnosis not present

## 2019-09-23 DIAGNOSIS — Z6841 Body Mass Index (BMI) 40.0 and over, adult: Secondary | ICD-10-CM | POA: Diagnosis not present

## 2019-09-23 DIAGNOSIS — I10 Essential (primary) hypertension: Secondary | ICD-10-CM | POA: Diagnosis not present

## 2019-10-19 DIAGNOSIS — K219 Gastro-esophageal reflux disease without esophagitis: Secondary | ICD-10-CM | POA: Diagnosis not present

## 2019-10-19 DIAGNOSIS — I10 Essential (primary) hypertension: Secondary | ICD-10-CM | POA: Diagnosis not present

## 2019-10-19 DIAGNOSIS — E038 Other specified hypothyroidism: Secondary | ICD-10-CM | POA: Diagnosis not present

## 2019-11-10 ENCOUNTER — Ambulatory Visit: Payer: Medicare HMO | Admitting: Dietician

## 2019-11-16 DIAGNOSIS — K219 Gastro-esophageal reflux disease without esophagitis: Secondary | ICD-10-CM | POA: Diagnosis not present

## 2019-11-16 DIAGNOSIS — E038 Other specified hypothyroidism: Secondary | ICD-10-CM | POA: Diagnosis not present

## 2019-11-16 DIAGNOSIS — I10 Essential (primary) hypertension: Secondary | ICD-10-CM | POA: Diagnosis not present

## 2019-11-18 DIAGNOSIS — H40013 Open angle with borderline findings, low risk, bilateral: Secondary | ICD-10-CM | POA: Diagnosis not present

## 2019-11-18 DIAGNOSIS — H01131 Eczematous dermatitis of right upper eyelid: Secondary | ICD-10-CM | POA: Diagnosis not present

## 2019-11-18 DIAGNOSIS — H04123 Dry eye syndrome of bilateral lacrimal glands: Secondary | ICD-10-CM | POA: Diagnosis not present

## 2019-11-18 DIAGNOSIS — H01134 Eczematous dermatitis of left upper eyelid: Secondary | ICD-10-CM | POA: Diagnosis not present

## 2019-11-18 DIAGNOSIS — H01132 Eczematous dermatitis of right lower eyelid: Secondary | ICD-10-CM | POA: Diagnosis not present

## 2019-11-18 DIAGNOSIS — H01135 Eczematous dermatitis of left lower eyelid: Secondary | ICD-10-CM | POA: Diagnosis not present

## 2019-11-18 DIAGNOSIS — Z961 Presence of intraocular lens: Secondary | ICD-10-CM | POA: Diagnosis not present

## 2019-11-20 ENCOUNTER — Encounter: Payer: Medicare HMO | Attending: Surgery | Admitting: Dietician

## 2019-11-20 ENCOUNTER — Encounter: Payer: Self-pay | Admitting: Dietician

## 2019-11-20 ENCOUNTER — Other Ambulatory Visit: Payer: Self-pay

## 2019-11-20 DIAGNOSIS — Z9884 Bariatric surgery status: Secondary | ICD-10-CM | POA: Insufficient documentation

## 2019-11-20 NOTE — Progress Notes (Signed)
Follow-Up Nutrition Visit 2 Years + 4 Months Post-Op Bariatric Surgery Appt Start Time: 10:30am  End Time: 11:00am  Surgery Type: Sleeve  Surgery Date: 07/23/2017  Primary concerns today: weight regain  Non scale victories: breathe better, can tolerate more protein options, no longer using walker, taking steps instead of the ramp, reduced thyroid medication, no longer taking asthma medications, able to do more around the house   NUTRITION ASSESSMENT   Anthropometrics Start Weight at [facility]: 335 lbs Weight today: 255.8 lbs  Clinical  Medical Hx: asthma, cancer, HTN, diabetes, DVT, COVID-19, pneumonia  Lifestyle & Dietary Hx Typical meal pattern is 3 meals plus 1 snack per day. Husband does the cooking. Eats out often. Likes fried foods but will limit these and pull the breading off most of the time. May have fried cheese sticks, onion rings, a slice of bologna, Little Debbie frosted snacks, Nutter Butters, or sweets for snacks.   24-Hr Dietary Recall First Meal: 1 egg (6g) + 1 sausage link (7g) + 1 pack cheese grits (2g) Snack: -  Second Meal: hamburger patty + cheese + chili   Snack: - Third Meal: vegetable beef soup + crackers + salad  Snack: Uncle Ray's sour cream & cheddar chips  Beverages: water, tea (decaf sweetened w/ Splenda), black decaf coffee  Estimated daily fluid intake: 40-50 ounces Estimated daily protein intake: ~60 grams    Medications: see list  Supplementation: [employer] chewable + Viactiv 3x/day + Tums 2 at bedtime + biotin + turmeric  Using straws: no Drinking while eating: no Difficulty chewing: no Chewing/swallowing difficulties: no Changes in vision: no Changes to mood/headaches: no Hair loss/Changes to skin/Changes to nails: hair loss Any difficulty focusing or concentrating: no Sweating: no Dizziness/Lightheaded: no Palpitations: no  Carbonated beverages: no N/V/D/C/GAS: no Abdominal Pain: no Dumping syndrome: no  Recent physical  activity:  walking at home and in church parking lot, body weight & chair exercises   NUTRITION DIAGNOSIS Undesirable food choices (NB-1.7) related to inadherance to nutrition recommendations s/p bariatric surgery as evidenced by patient reported dietary history.     NUTRITION INTERVENTION Nutrition education and counseling.   Progress towards goal(s):  in progress  Goals established by pt:   Aim for 64+ ounces of fluid per day, with at least half being plain water.   No more than 1/3 cup of carbohydrate food per meal.   Handouts given during visit include:  Phase 7: Lifelong Maintenance  Bariatric MyPlate  Barriers to learning/adherence to lifestyle change: contemplative stage of change  Demonstrated degree of understanding via: teach back     MONITORING / EVALUATION   Dietary intake, exercise, body weight, and goals at next nutrition visit.   Patient is to follow up at [facility] for next visit in 3 months.

## 2019-11-20 NOTE — Patient Instructions (Signed)
   Aim for 64+ ounces of fluid per day, with at least half being plain water.   No more than 1/3 cup of carbohydrate food per meal.

## 2019-11-27 DIAGNOSIS — Z09 Encounter for follow-up examination after completed treatment for conditions other than malignant neoplasm: Secondary | ICD-10-CM | POA: Diagnosis not present

## 2019-12-15 DIAGNOSIS — E038 Other specified hypothyroidism: Secondary | ICD-10-CM | POA: Diagnosis not present

## 2019-12-15 DIAGNOSIS — I10 Essential (primary) hypertension: Secondary | ICD-10-CM | POA: Diagnosis not present

## 2019-12-15 DIAGNOSIS — K219 Gastro-esophageal reflux disease without esophagitis: Secondary | ICD-10-CM | POA: Diagnosis not present

## 2019-12-30 DIAGNOSIS — Z1389 Encounter for screening for other disorder: Secondary | ICD-10-CM | POA: Diagnosis not present

## 2019-12-30 DIAGNOSIS — Z1331 Encounter for screening for depression: Secondary | ICD-10-CM | POA: Diagnosis not present

## 2019-12-30 DIAGNOSIS — I10 Essential (primary) hypertension: Secondary | ICD-10-CM | POA: Diagnosis not present

## 2019-12-30 DIAGNOSIS — E038 Other specified hypothyroidism: Secondary | ICD-10-CM | POA: Diagnosis not present

## 2019-12-30 DIAGNOSIS — Z6841 Body Mass Index (BMI) 40.0 and over, adult: Secondary | ICD-10-CM | POA: Diagnosis not present

## 2019-12-30 DIAGNOSIS — Z0000 Encounter for general adult medical examination without abnormal findings: Secondary | ICD-10-CM | POA: Diagnosis not present

## 2020-02-03 DIAGNOSIS — I10 Essential (primary) hypertension: Secondary | ICD-10-CM | POA: Diagnosis not present

## 2020-02-03 DIAGNOSIS — E038 Other specified hypothyroidism: Secondary | ICD-10-CM | POA: Diagnosis not present

## 2020-02-16 ENCOUNTER — Other Ambulatory Visit: Payer: Self-pay

## 2020-02-16 ENCOUNTER — Encounter: Payer: Medicare HMO | Attending: Surgery | Admitting: Dietician

## 2020-02-16 DIAGNOSIS — Z9884 Bariatric surgery status: Secondary | ICD-10-CM

## 2020-02-16 NOTE — Progress Notes (Signed)
Follow-Up Nutrition Visit 2 Years + 7 Months Post-Op Bariatric Surgery Appt Start Time: 10:00am  End Time: 10:50am  Surgery Type: Sleeve  Surgery Date: 07/23/2017  Primary concerns today: weight regain  Non scale victories: breathe better, can tolerate more protein options, no longer using walker, taking steps instead of the ramp, reduced thyroid medication, no longer taking asthma medications, able to do more around the house   NUTRITION ASSESSMENT   Anthropometrics Start Weight at [facility]: 335 lbs Weight today: 258.9 lbs  Clinical  Medical Hx: asthma, cancer, HTN, diabetes, DVT, COVID-19, pneumonia  Lifestyle & Dietary Hx States she feels defeated because she has regained weight. States she was doing well and got weight down to ~220 lbs then was sick with COVID-19 and has been "set back" ever since. States she feels like she is "doing wrong" by eating pasta and other carbohydrate foods.  May have the following within a day to eat:    - 1 oscar meyer sausage (7g) + 1 fried egg w/ margarine + 1 packet instant cheese grits w/ butter   - 2 oz chicken fillet patty (14g) w/ lettuce and tomato    - 3 oz chicken & dumplings (21g) + green beans + banana pudding    - 1 slice bologna (6g) + sour cream & chives chips (2g)  For breakfast this morning, had [employer] ham & cheese (w/o the croissant) + hashbrowns. Common foods include fried foods, hot dogs, eggs, bologna, seafood, cheese grits.   24-Hr Dietary Recall First Meal: 1 egg (6g) + instant cheese grits (2g) + water Snack: -  Second Meal: broiled shrimp  Snack: - Third Meal: 5-6 [employer] chicken nuggets   Snack: - Beverages: water, tea (decaf sweetened w/ Splenda), black decaf coffee  Estimated daily fluid intake: 40-50 ounces Estimated daily protein intake: ~60 grams    Medications: see list  Supplementation: [employer] chewable + Viactiv 3x/day + Tums 2 at bedtime + biotin + turmeric  Using straws: no Drinking while  eating: no Difficulty chewing: no Chewing/swallowing difficulties: no Changes in vision: no Changes to mood/headaches: no Hair loss/Changes to skin/Changes to nails: no Any difficulty focusing or concentrating: no Sweating: no Dizziness/Lightheaded: no Palpitations: no  Carbonated beverages: no N/V/D/C/GAS: diarrhea (occasionally)  Abdominal Pain: no Dumping syndrome: yes (twice)  Recent physical activity: none recently    NUTRITION DIAGNOSIS Undesirable food choices (NB-1.7) related to inadherance to nutrition recommendations s/p bariatric surgery as evidenced by patient reported dietary history.     NUTRITION INTERVENTION Nutrition education and counseling.   Progress towards goal(s):  in progress  Goals established by pt:   Aim for 64+ ounces of fluid per day, with at least half being plain water.   Go back to high protein diet (similar to pre-op diet of lean proteins, protein shakes, non starchy vegetables, and lots of fluid)   Handouts given during visit include:  High Protein (Pre-Op) Diet   Barriers to learning/adherence to lifestyle change: contemplative stage of change  Demonstrated degree of understanding via: teach back     MONITORING / EVALUATION   Dietary intake, exercise, body weight, and goals at next nutrition visit.   Patient is to follow up at [facility] for next visit in 3 months.

## 2020-02-16 NOTE — Patient Instructions (Addendum)
Remember to focus on the following:   Drink lots of fluids, especially focusing on lots of water (at least 32 ounces of plain water every day.) No sugar-sweetened beverages.   Focus on high protein diet with lean sources of protein and lots of non-starchy vegetables.   Eat small, frequent meals/snacks and replace at least 1 with a protein shake. Eat when you feel hungry, stop when you feel full.

## 2020-02-17 ENCOUNTER — Telehealth: Payer: Self-pay | Admitting: Dietician

## 2020-02-17 NOTE — Telephone Encounter (Signed)
Patient called with questions regarding Premier Protein shakes- questions were answered.   Plan to follow up in about 3 months.    Herschel [place]) Etola Mull, MS, RD, LDN

## 2020-02-18 DIAGNOSIS — E038 Other specified hypothyroidism: Secondary | ICD-10-CM | POA: Diagnosis not present

## 2020-02-18 DIAGNOSIS — I10 Essential (primary) hypertension: Secondary | ICD-10-CM | POA: Diagnosis not present

## 2020-03-22 DIAGNOSIS — I10 Essential (primary) hypertension: Secondary | ICD-10-CM | POA: Diagnosis not present

## 2020-03-22 DIAGNOSIS — E038 Other specified hypothyroidism: Secondary | ICD-10-CM | POA: Diagnosis not present

## 2020-03-30 DIAGNOSIS — Z6841 Body Mass Index (BMI) 40.0 and over, adult: Secondary | ICD-10-CM | POA: Diagnosis not present

## 2020-03-30 DIAGNOSIS — E038 Other specified hypothyroidism: Secondary | ICD-10-CM | POA: Diagnosis not present

## 2020-03-30 DIAGNOSIS — I10 Essential (primary) hypertension: Secondary | ICD-10-CM | POA: Diagnosis not present

## 2020-04-13 DIAGNOSIS — E038 Other specified hypothyroidism: Secondary | ICD-10-CM | POA: Diagnosis not present

## 2020-04-13 DIAGNOSIS — I10 Essential (primary) hypertension: Secondary | ICD-10-CM | POA: Diagnosis not present

## 2020-05-10 DIAGNOSIS — E038 Other specified hypothyroidism: Secondary | ICD-10-CM | POA: Diagnosis not present

## 2020-05-10 DIAGNOSIS — I10 Essential (primary) hypertension: Secondary | ICD-10-CM | POA: Diagnosis not present

## 2020-05-11 DIAGNOSIS — Z9884 Bariatric surgery status: Secondary | ICD-10-CM | POA: Diagnosis not present

## 2020-05-17 ENCOUNTER — Ambulatory Visit: Payer: Medicare HMO | Admitting: Dietician

## 2020-06-06 DIAGNOSIS — E038 Other specified hypothyroidism: Secondary | ICD-10-CM | POA: Diagnosis not present

## 2020-06-06 DIAGNOSIS — I10 Essential (primary) hypertension: Secondary | ICD-10-CM | POA: Diagnosis not present

## 2020-06-13 DIAGNOSIS — M76822 Posterior tibial tendinitis, left leg: Secondary | ICD-10-CM | POA: Diagnosis not present

## 2020-06-13 DIAGNOSIS — M7672 Peroneal tendinitis, left leg: Secondary | ICD-10-CM | POA: Diagnosis not present

## 2020-06-14 DIAGNOSIS — R69 Illness, unspecified: Secondary | ICD-10-CM | POA: Diagnosis not present

## 2020-06-14 DIAGNOSIS — W19XXXA Unspecified fall, initial encounter: Secondary | ICD-10-CM | POA: Diagnosis not present

## 2020-06-21 ENCOUNTER — Ambulatory Visit: Payer: Medicare HMO | Admitting: Dietician

## 2020-07-05 DIAGNOSIS — Z6841 Body Mass Index (BMI) 40.0 and over, adult: Secondary | ICD-10-CM | POA: Diagnosis not present

## 2020-07-05 DIAGNOSIS — M48061 Spinal stenosis, lumbar region without neurogenic claudication: Secondary | ICD-10-CM | POA: Diagnosis not present

## 2020-07-05 DIAGNOSIS — E038 Other specified hypothyroidism: Secondary | ICD-10-CM | POA: Diagnosis not present

## 2020-07-05 DIAGNOSIS — I10 Essential (primary) hypertension: Secondary | ICD-10-CM | POA: Diagnosis not present

## 2020-07-26 DIAGNOSIS — Z96653 Presence of artificial knee joint, bilateral: Secondary | ICD-10-CM | POA: Diagnosis not present

## 2020-07-26 DIAGNOSIS — M7672 Peroneal tendinitis, left leg: Secondary | ICD-10-CM | POA: Diagnosis not present

## 2020-07-26 DIAGNOSIS — M76822 Posterior tibial tendinitis, left leg: Secondary | ICD-10-CM | POA: Diagnosis not present

## 2020-07-26 DIAGNOSIS — M79672 Pain in left foot: Secondary | ICD-10-CM | POA: Diagnosis not present

## 2020-08-03 DIAGNOSIS — I10 Essential (primary) hypertension: Secondary | ICD-10-CM | POA: Diagnosis not present

## 2020-08-03 DIAGNOSIS — E038 Other specified hypothyroidism: Secondary | ICD-10-CM | POA: Diagnosis not present

## 2020-08-19 DIAGNOSIS — E038 Other specified hypothyroidism: Secondary | ICD-10-CM | POA: Diagnosis not present

## 2020-08-19 DIAGNOSIS — I10 Essential (primary) hypertension: Secondary | ICD-10-CM | POA: Diagnosis not present

## 2020-09-20 DIAGNOSIS — I10 Essential (primary) hypertension: Secondary | ICD-10-CM | POA: Diagnosis not present

## 2020-09-20 DIAGNOSIS — E038 Other specified hypothyroidism: Secondary | ICD-10-CM | POA: Diagnosis not present

## 2020-10-10 DIAGNOSIS — Z6841 Body Mass Index (BMI) 40.0 and over, adult: Secondary | ICD-10-CM | POA: Diagnosis not present

## 2020-10-10 DIAGNOSIS — I10 Essential (primary) hypertension: Secondary | ICD-10-CM | POA: Diagnosis not present

## 2020-10-10 DIAGNOSIS — Z79899 Other long term (current) drug therapy: Secondary | ICD-10-CM | POA: Diagnosis not present

## 2020-10-10 DIAGNOSIS — Z0000 Encounter for general adult medical examination without abnormal findings: Secondary | ICD-10-CM | POA: Diagnosis not present

## 2020-10-10 DIAGNOSIS — M48061 Spinal stenosis, lumbar region without neurogenic claudication: Secondary | ICD-10-CM | POA: Diagnosis not present

## 2020-10-10 DIAGNOSIS — E038 Other specified hypothyroidism: Secondary | ICD-10-CM | POA: Diagnosis not present

## 2020-10-18 DIAGNOSIS — I10 Essential (primary) hypertension: Secondary | ICD-10-CM | POA: Diagnosis not present

## 2020-10-18 DIAGNOSIS — E038 Other specified hypothyroidism: Secondary | ICD-10-CM | POA: Diagnosis not present

## 2020-11-08 DIAGNOSIS — J40 Bronchitis, not specified as acute or chronic: Secondary | ICD-10-CM | POA: Diagnosis not present

## 2020-11-08 DIAGNOSIS — Z6841 Body Mass Index (BMI) 40.0 and over, adult: Secondary | ICD-10-CM | POA: Diagnosis not present

## 2020-11-16 DIAGNOSIS — Z9884 Bariatric surgery status: Secondary | ICD-10-CM | POA: Diagnosis not present

## 2020-12-07 DIAGNOSIS — H40013 Open angle with borderline findings, low risk, bilateral: Secondary | ICD-10-CM | POA: Diagnosis not present

## 2020-12-07 DIAGNOSIS — Z961 Presence of intraocular lens: Secondary | ICD-10-CM | POA: Diagnosis not present

## 2020-12-07 DIAGNOSIS — H01132 Eczematous dermatitis of right lower eyelid: Secondary | ICD-10-CM | POA: Diagnosis not present

## 2020-12-07 DIAGNOSIS — H01135 Eczematous dermatitis of left lower eyelid: Secondary | ICD-10-CM | POA: Diagnosis not present

## 2020-12-07 DIAGNOSIS — H04123 Dry eye syndrome of bilateral lacrimal glands: Secondary | ICD-10-CM | POA: Diagnosis not present

## 2020-12-07 DIAGNOSIS — H01131 Eczematous dermatitis of right upper eyelid: Secondary | ICD-10-CM | POA: Diagnosis not present

## 2020-12-07 DIAGNOSIS — H01134 Eczematous dermatitis of left upper eyelid: Secondary | ICD-10-CM | POA: Diagnosis not present

## 2020-12-28 DIAGNOSIS — Z9884 Bariatric surgery status: Secondary | ICD-10-CM | POA: Diagnosis not present

## 2021-01-17 DIAGNOSIS — J40 Bronchitis, not specified as acute or chronic: Secondary | ICD-10-CM | POA: Diagnosis not present

## 2021-01-17 DIAGNOSIS — Z6841 Body Mass Index (BMI) 40.0 and over, adult: Secondary | ICD-10-CM | POA: Diagnosis not present

## 2021-01-17 DIAGNOSIS — E038 Other specified hypothyroidism: Secondary | ICD-10-CM | POA: Diagnosis not present

## 2021-01-17 DIAGNOSIS — K580 Irritable bowel syndrome with diarrhea: Secondary | ICD-10-CM | POA: Diagnosis not present

## 2021-04-17 DIAGNOSIS — Z1231 Encounter for screening mammogram for malignant neoplasm of breast: Secondary | ICD-10-CM | POA: Diagnosis not present

## 2021-04-25 DIAGNOSIS — Z0000 Encounter for general adult medical examination without abnormal findings: Secondary | ICD-10-CM | POA: Diagnosis not present

## 2021-04-25 DIAGNOSIS — Z6841 Body Mass Index (BMI) 40.0 and over, adult: Secondary | ICD-10-CM | POA: Diagnosis not present

## 2021-04-25 DIAGNOSIS — I10 Essential (primary) hypertension: Secondary | ICD-10-CM | POA: Diagnosis not present

## 2021-04-25 DIAGNOSIS — E038 Other specified hypothyroidism: Secondary | ICD-10-CM | POA: Diagnosis not present

## 2021-04-25 DIAGNOSIS — K580 Irritable bowel syndrome with diarrhea: Secondary | ICD-10-CM | POA: Diagnosis not present

## 2021-05-24 ENCOUNTER — Other Ambulatory Visit: Payer: Self-pay | Admitting: Obstetrics and Gynecology

## 2021-05-24 DIAGNOSIS — N644 Mastodynia: Secondary | ICD-10-CM

## 2021-05-24 DIAGNOSIS — Z803 Family history of malignant neoplasm of breast: Secondary | ICD-10-CM | POA: Diagnosis not present

## 2021-05-25 ENCOUNTER — Other Ambulatory Visit: Payer: Self-pay | Admitting: Obstetrics and Gynecology

## 2021-05-25 ENCOUNTER — Inpatient Hospital Stay
Admission: RE | Admit: 2021-05-25 | Discharge: 2021-05-25 | Disposition: A | Discharge status: Home / Self Care | Payer: Self-pay | Source: Ambulatory Visit | Attending: *Deleted | Admitting: *Deleted

## 2021-05-25 ENCOUNTER — Other Ambulatory Visit: Payer: Self-pay | Admitting: *Deleted

## 2021-05-25 DIAGNOSIS — Z1231 Encounter for screening mammogram for malignant neoplasm of breast: Secondary | ICD-10-CM

## 2021-05-25 DIAGNOSIS — N644 Mastodynia: Secondary | ICD-10-CM

## 2021-06-02 ENCOUNTER — Ambulatory Visit
Admission: RE | Admit: 2021-06-02 | Discharge: 2021-06-02 | Disposition: A | Discharge status: Home / Self Care | Payer: Medicare HMO | Source: Ambulatory Visit | Attending: Obstetrics and Gynecology | Admitting: Obstetrics and Gynecology

## 2021-06-02 ENCOUNTER — Other Ambulatory Visit: Payer: Self-pay

## 2021-06-02 DIAGNOSIS — Z803 Family history of malignant neoplasm of breast: Secondary | ICD-10-CM | POA: Insufficient documentation

## 2021-06-02 DIAGNOSIS — N644 Mastodynia: Secondary | ICD-10-CM

## 2021-06-08 ENCOUNTER — Ambulatory Visit (INDEPENDENT_AMBULATORY_CARE_PROVIDER_SITE_OTHER): Payer: Medicare HMO

## 2021-06-08 ENCOUNTER — Ambulatory Visit (INDEPENDENT_AMBULATORY_CARE_PROVIDER_SITE_OTHER): Payer: Medicare HMO | Admitting: Orthopaedic Surgery

## 2021-06-08 ENCOUNTER — Encounter: Payer: Self-pay | Admitting: Orthopaedic Surgery

## 2021-06-08 ENCOUNTER — Other Ambulatory Visit: Payer: Self-pay

## 2021-06-08 VITALS — Ht 61.0 in | Wt 258.0 lb

## 2021-06-08 DIAGNOSIS — M25561 Pain in right knee: Secondary | ICD-10-CM

## 2021-06-08 NOTE — Progress Notes (Signed)
Office Visit Note   Patient: Annette Smith           Date of Birth: 1951/09/12           MRN: [id number] Visit Date: 06/08/2021              Requested by: Toma Deiters, MD [address] [place],  [place] [address] PCP: Toma Deiters, MD   Assessment & Plan: Visit Diagnoses:  1. Acute pain of right knee     Plan: Patient has a knee contusion with ecchymosis and subcutaneous hematoma.  She was hoping that just 1 needle could be put in and drained it all out and we discussed with her that this will take time for it to resolve.  As hematoma and bruising resolved she can work on straight leg raising and then progressive weightbearing as tolerated.  Follow-Up Instructions: No follow-ups on file.   Orders:  Orders Placed This Encounter  Procedures   XR Knee 1-2 Views Right   No orders of the defined types were placed in this encounter.     Procedures: No procedures performed   Clinical Data: No additional findings.   Subjective: Chief Complaint  Patient presents with   Right Knee - Pain    DOI 05/25/2021    HPI 70 year old female was injured on 05/25/2021 when she was sitting down for the second time at the picnic table and the picnic table legs folded in the middle causing the table to collapse onto her leg.  She has had previous total knee arthroplasties by Dr. Maia Petties 2003.  She has considerable ecchymosis over the right medial patellar region that extends several centimeters above and below the knee.  Had difficulty walking and has been using a Rollator walker that she has had for a while.  She normally only uses it if she is walking extensively.  She is use some ice and also heat.  She states she was given some pain medication which is helped.  She does have history of gout hypertension.  Review of Systems all the systems noncontributory to HPI.   Objective: Vital Signs: Ht 5\' 1"  (1.549 m)   Wt 258 lb (117 kg)   BMI 48.75 kg/m   Physical  Exam Constitutional:      Appearance: She is well-developed.  HENT:     Head: Normocephalic.     Right Ear: External ear normal.     Left Ear: External ear normal. There is no impacted cerumen.  Eyes:     Pupils: Pupils are equal, round, and reactive to light.  Neck:     Thyroid: No thyromegaly.     Trachea: No tracheal deviation.  Cardiovascular:     Rate and Rhythm: Normal rate.  Pulmonary:     Effort: Pulmonary effort is normal.  Abdominal:     Palpations: Abdomen is soft.  Musculoskeletal:     Cervical back: No rigidity.  Skin:    General: Skin is warm and dry.  Neurological:     Mental Status: She is alert and oriented to person, place, and time.  Psychiatric:        Behavior: Behavior normal.    Ortho Exam patient has considerable ecchymosis but can do a straight leg raise full extension of her knee collateral ligaments are stable.  Prepatellar bursal hematoma extending out over the VMO with multiple colors from resolving hematoma metabolism.  Skin is intact.  Specialty Comments:  No specialty comments available.  Imaging:  No results found.   PMFS History: Patient Active Problem List   Diagnosis Date Noted   History of colonic polyps 11/04/2018   Esophageal dysphagia 11/04/2018   S/P laparoscopic sleeve gastrectomy Sept 2018 07/23/2017   Past Medical History:  Diagnosis Date   Asthma    Baker's cyst 2007   Bulging lumbar disc    DVT (deep venous thrombosis) (HCC)    both legs   Hypertension    Phlebitis 1970s   bilateral LE    Pneumonia    PONV (postoperative nausea and vomiting)     Family History  Problem Relation Age of Onset   Asthma Other    Cancer Other    Hypertension Other    Diabetes Other     Past Surgical History:  Procedure Laterality Date   APPENDECTOMY     BRAIN TUMOR EXCISION  1995   BREAST BIOPSY     CHOLECYSTECTOMY     COLONOSCOPY N/A 12/04/2018   Procedure: COLONOSCOPY;  Surgeon: Malissa Hippo, MD;  Location: [facility];  Service: Endoscopy;  Laterality: N/A;  2:00pm   ESOPHAGEAL DILATION N/A 12/04/2018   Procedure: ESOPHAGEAL DILATION;  Surgeon: Malissa Hippo, MD;  Location: [facility];  Service: Endoscopy;  Laterality: N/A;   ESOPHAGOGASTRODUODENOSCOPY N/A 12/04/2018   Procedure: ESOPHAGOGASTRODUODENOSCOPY (EGD);  Surgeon: Malissa Hippo, MD;  Location: [facility];  Service: Endoscopy;  Laterality: N/A;   JOINT REPLACEMENT     LTKA 2010; RTKA 2003   LAPAROSCOPIC GASTRIC SLEEVE RESECTION WITH HIATAL HERNIA REPAIR N/A 07/23/2017   Procedure: LAPAROSCOPIC GASTRIC SLEEVE RESECTION WITH HIATAL HERNIA REPAIR AND UPPER ENDOSCOPY;  Surgeon: Luretha Murphy, MD;  Location: [facility];  Service: General;  Laterality: N/A;   Removal of benign brain tumor     TUBAL LIGATION     Social History   Occupational History   Not on file  Tobacco Use   Smoking status: Never   Smokeless tobacco: Never  Vaping Use   Vaping Use: Never used  Substance and Sexual Activity   Alcohol use: No   Drug use: No   Sexual activity: Not on file

## 2021-06-21 DIAGNOSIS — Z9884 Bariatric surgery status: Secondary | ICD-10-CM | POA: Diagnosis not present

## 2021-06-27 DIAGNOSIS — Z961 Presence of intraocular lens: Secondary | ICD-10-CM | POA: Diagnosis not present

## 2021-06-27 DIAGNOSIS — H01134 Eczematous dermatitis of left upper eyelid: Secondary | ICD-10-CM | POA: Diagnosis not present

## 2021-06-27 DIAGNOSIS — H01131 Eczematous dermatitis of right upper eyelid: Secondary | ICD-10-CM | POA: Diagnosis not present

## 2021-06-27 DIAGNOSIS — H40013 Open angle with borderline findings, low risk, bilateral: Secondary | ICD-10-CM | POA: Diagnosis not present

## 2021-06-27 DIAGNOSIS — H04123 Dry eye syndrome of bilateral lacrimal glands: Secondary | ICD-10-CM | POA: Diagnosis not present

## 2021-06-27 DIAGNOSIS — H01132 Eczematous dermatitis of right lower eyelid: Secondary | ICD-10-CM | POA: Diagnosis not present

## 2021-06-27 DIAGNOSIS — H01135 Eczematous dermatitis of left lower eyelid: Secondary | ICD-10-CM | POA: Diagnosis not present

## 2021-08-01 DIAGNOSIS — Z6841 Body Mass Index (BMI) 40.0 and over, adult: Secondary | ICD-10-CM | POA: Diagnosis not present

## 2021-08-01 DIAGNOSIS — R7303 Prediabetes: Secondary | ICD-10-CM | POA: Diagnosis not present

## 2021-08-01 DIAGNOSIS — E038 Other specified hypothyroidism: Secondary | ICD-10-CM | POA: Diagnosis not present

## 2021-08-01 DIAGNOSIS — I10 Essential (primary) hypertension: Secondary | ICD-10-CM | POA: Diagnosis not present

## 2021-08-01 DIAGNOSIS — E7849 Other hyperlipidemia: Secondary | ICD-10-CM | POA: Diagnosis not present

## 2021-08-01 DIAGNOSIS — K580 Irritable bowel syndrome with diarrhea: Secondary | ICD-10-CM | POA: Diagnosis not present

## 2021-11-20 DIAGNOSIS — J45909 Unspecified asthma, uncomplicated: Secondary | ICD-10-CM | POA: Diagnosis not present

## 2021-11-20 DIAGNOSIS — K219 Gastro-esophageal reflux disease without esophagitis: Secondary | ICD-10-CM | POA: Diagnosis not present

## 2021-11-20 DIAGNOSIS — E038 Other specified hypothyroidism: Secondary | ICD-10-CM | POA: Diagnosis not present

## 2021-11-20 DIAGNOSIS — I10 Essential (primary) hypertension: Secondary | ICD-10-CM | POA: Diagnosis not present

## 2021-11-20 DIAGNOSIS — K580 Irritable bowel syndrome with diarrhea: Secondary | ICD-10-CM | POA: Diagnosis not present

## 2021-11-20 DIAGNOSIS — Z6841 Body Mass Index (BMI) 40.0 and over, adult: Secondary | ICD-10-CM | POA: Diagnosis not present

## 2021-11-20 DIAGNOSIS — Z0000 Encounter for general adult medical examination without abnormal findings: Secondary | ICD-10-CM | POA: Diagnosis not present

## 2022-01-02 DIAGNOSIS — H01131 Eczematous dermatitis of right upper eyelid: Secondary | ICD-10-CM | POA: Diagnosis not present

## 2022-01-02 DIAGNOSIS — Z961 Presence of intraocular lens: Secondary | ICD-10-CM | POA: Diagnosis not present

## 2022-01-02 DIAGNOSIS — H01135 Eczematous dermatitis of left lower eyelid: Secondary | ICD-10-CM | POA: Diagnosis not present

## 2022-01-02 DIAGNOSIS — H01132 Eczematous dermatitis of right lower eyelid: Secondary | ICD-10-CM | POA: Diagnosis not present

## 2022-01-02 DIAGNOSIS — H01134 Eczematous dermatitis of left upper eyelid: Secondary | ICD-10-CM | POA: Diagnosis not present

## 2022-01-02 DIAGNOSIS — H40013 Open angle with borderline findings, low risk, bilateral: Secondary | ICD-10-CM | POA: Diagnosis not present

## 2022-01-02 DIAGNOSIS — H04123 Dry eye syndrome of bilateral lacrimal glands: Secondary | ICD-10-CM | POA: Diagnosis not present

## 2022-02-19 DIAGNOSIS — J45909 Unspecified asthma, uncomplicated: Secondary | ICD-10-CM | POA: Diagnosis not present

## 2022-02-19 DIAGNOSIS — K580 Irritable bowel syndrome with diarrhea: Secondary | ICD-10-CM | POA: Diagnosis not present

## 2022-02-19 DIAGNOSIS — I10 Essential (primary) hypertension: Secondary | ICD-10-CM | POA: Diagnosis not present

## 2022-02-19 DIAGNOSIS — E038 Other specified hypothyroidism: Secondary | ICD-10-CM | POA: Diagnosis not present

## 2022-02-19 DIAGNOSIS — Z0000 Encounter for general adult medical examination without abnormal findings: Secondary | ICD-10-CM | POA: Diagnosis not present

## 2022-02-19 DIAGNOSIS — M545 Low back pain, unspecified: Secondary | ICD-10-CM | POA: Diagnosis not present

## 2022-02-19 DIAGNOSIS — Z79899 Other long term (current) drug therapy: Secondary | ICD-10-CM | POA: Diagnosis not present

## 2022-02-19 DIAGNOSIS — Z6841 Body Mass Index (BMI) 40.0 and over, adult: Secondary | ICD-10-CM | POA: Diagnosis not present

## 2022-02-19 DIAGNOSIS — K219 Gastro-esophageal reflux disease without esophagitis: Secondary | ICD-10-CM | POA: Diagnosis not present

## 2022-04-20 ENCOUNTER — Other Ambulatory Visit: Payer: Self-pay | Admitting: Internal Medicine

## 2022-04-20 DIAGNOSIS — Z1231 Encounter for screening mammogram for malignant neoplasm of breast: Secondary | ICD-10-CM

## 2022-05-29 DIAGNOSIS — K219 Gastro-esophageal reflux disease without esophagitis: Secondary | ICD-10-CM | POA: Diagnosis not present

## 2022-05-29 DIAGNOSIS — M1009 Idiopathic gout, multiple sites: Secondary | ICD-10-CM | POA: Diagnosis not present

## 2022-05-29 DIAGNOSIS — I10 Essential (primary) hypertension: Secondary | ICD-10-CM | POA: Diagnosis not present

## 2022-05-29 DIAGNOSIS — M545 Low back pain, unspecified: Secondary | ICD-10-CM | POA: Diagnosis not present

## 2022-05-29 DIAGNOSIS — J45909 Unspecified asthma, uncomplicated: Secondary | ICD-10-CM | POA: Diagnosis not present

## 2022-05-29 DIAGNOSIS — K580 Irritable bowel syndrome with diarrhea: Secondary | ICD-10-CM | POA: Diagnosis not present

## 2022-05-29 DIAGNOSIS — E038 Other specified hypothyroidism: Secondary | ICD-10-CM | POA: Diagnosis not present

## 2022-05-29 DIAGNOSIS — Z6841 Body Mass Index (BMI) 40.0 and over, adult: Secondary | ICD-10-CM | POA: Diagnosis not present

## 2022-06-04 ENCOUNTER — Ambulatory Visit
Admission: RE | Admit: 2022-06-04 | Discharge: 2022-06-04 | Disposition: A | Discharge status: Home / Self Care | Payer: Medicare HMO | Source: Ambulatory Visit | Attending: Internal Medicine | Admitting: Internal Medicine

## 2022-06-04 DIAGNOSIS — Z1231 Encounter for screening mammogram for malignant neoplasm of breast: Secondary | ICD-10-CM | POA: Diagnosis not present

## 2022-06-05 DIAGNOSIS — H40013 Open angle with borderline findings, low risk, bilateral: Secondary | ICD-10-CM | POA: Diagnosis not present

## 2022-06-05 DIAGNOSIS — H5213 Myopia, bilateral: Secondary | ICD-10-CM | POA: Diagnosis not present

## 2022-06-05 DIAGNOSIS — H35363 Drusen (degenerative) of macula, bilateral: Secondary | ICD-10-CM | POA: Diagnosis not present

## 2022-06-05 DIAGNOSIS — H43813 Vitreous degeneration, bilateral: Secondary | ICD-10-CM | POA: Diagnosis not present

## 2022-07-18 DIAGNOSIS — Z9884 Bariatric surgery status: Secondary | ICD-10-CM | POA: Diagnosis not present

## 2022-08-08 DIAGNOSIS — H40013 Open angle with borderline findings, low risk, bilateral: Secondary | ICD-10-CM | POA: Diagnosis not present

## 2022-08-08 DIAGNOSIS — H0102B Squamous blepharitis left eye, upper and lower eyelids: Secondary | ICD-10-CM | POA: Diagnosis not present

## 2022-08-08 DIAGNOSIS — H0102A Squamous blepharitis right eye, upper and lower eyelids: Secondary | ICD-10-CM | POA: Diagnosis not present

## 2022-08-21 DIAGNOSIS — Z780 Asymptomatic menopausal state: Secondary | ICD-10-CM | POA: Diagnosis not present

## 2022-08-21 DIAGNOSIS — M810 Age-related osteoporosis without current pathological fracture: Secondary | ICD-10-CM | POA: Diagnosis not present

## 2022-09-04 DIAGNOSIS — M545 Low back pain, unspecified: Secondary | ICD-10-CM | POA: Diagnosis not present

## 2022-09-04 DIAGNOSIS — Z6841 Body Mass Index (BMI) 40.0 and over, adult: Secondary | ICD-10-CM | POA: Diagnosis not present

## 2022-09-04 DIAGNOSIS — E038 Other specified hypothyroidism: Secondary | ICD-10-CM | POA: Diagnosis not present

## 2022-09-04 DIAGNOSIS — M1009 Idiopathic gout, multiple sites: Secondary | ICD-10-CM | POA: Diagnosis not present

## 2022-09-04 DIAGNOSIS — K219 Gastro-esophageal reflux disease without esophagitis: Secondary | ICD-10-CM | POA: Diagnosis not present

## 2022-09-04 DIAGNOSIS — K580 Irritable bowel syndrome with diarrhea: Secondary | ICD-10-CM | POA: Diagnosis not present

## 2022-09-04 DIAGNOSIS — I10 Essential (primary) hypertension: Secondary | ICD-10-CM | POA: Diagnosis not present

## 2022-09-04 DIAGNOSIS — J45909 Unspecified asthma, uncomplicated: Secondary | ICD-10-CM | POA: Diagnosis not present

## 2022-09-07 DIAGNOSIS — E038 Other specified hypothyroidism: Secondary | ICD-10-CM | POA: Diagnosis not present

## 2022-09-07 DIAGNOSIS — J45909 Unspecified asthma, uncomplicated: Secondary | ICD-10-CM | POA: Diagnosis not present

## 2022-09-07 DIAGNOSIS — K219 Gastro-esophageal reflux disease without esophagitis: Secondary | ICD-10-CM | POA: Diagnosis not present

## 2022-09-07 DIAGNOSIS — M1009 Idiopathic gout, multiple sites: Secondary | ICD-10-CM | POA: Diagnosis not present

## 2022-09-07 DIAGNOSIS — M545 Low back pain, unspecified: Secondary | ICD-10-CM | POA: Diagnosis not present

## 2022-09-07 DIAGNOSIS — K580 Irritable bowel syndrome with diarrhea: Secondary | ICD-10-CM | POA: Diagnosis not present

## 2022-09-07 DIAGNOSIS — I10 Essential (primary) hypertension: Secondary | ICD-10-CM | POA: Diagnosis not present

## 2022-10-22 DIAGNOSIS — J204 Acute bronchitis due to parainfluenza virus: Secondary | ICD-10-CM | POA: Diagnosis not present

## 2022-10-22 DIAGNOSIS — Z6841 Body Mass Index (BMI) 40.0 and over, adult: Secondary | ICD-10-CM | POA: Diagnosis not present

## 2022-12-18 DIAGNOSIS — K580 Irritable bowel syndrome with diarrhea: Secondary | ICD-10-CM | POA: Diagnosis not present

## 2022-12-18 DIAGNOSIS — E038 Other specified hypothyroidism: Secondary | ICD-10-CM | POA: Diagnosis not present

## 2022-12-18 DIAGNOSIS — I10 Essential (primary) hypertension: Secondary | ICD-10-CM | POA: Diagnosis not present

## 2022-12-18 DIAGNOSIS — J45909 Unspecified asthma, uncomplicated: Secondary | ICD-10-CM | POA: Diagnosis not present

## 2022-12-18 DIAGNOSIS — Z0000 Encounter for general adult medical examination without abnormal findings: Secondary | ICD-10-CM | POA: Diagnosis not present

## 2022-12-18 DIAGNOSIS — K219 Gastro-esophageal reflux disease without esophagitis: Secondary | ICD-10-CM | POA: Diagnosis not present

## 2022-12-18 DIAGNOSIS — Z6841 Body Mass Index (BMI) 40.0 and over, adult: Secondary | ICD-10-CM | POA: Diagnosis not present

## 2022-12-18 DIAGNOSIS — M1009 Idiopathic gout, multiple sites: Secondary | ICD-10-CM | POA: Diagnosis not present

## 2022-12-18 DIAGNOSIS — M5459 Other low back pain: Secondary | ICD-10-CM | POA: Diagnosis not present

## 2023-01-17 DIAGNOSIS — H524 Presbyopia: Secondary | ICD-10-CM | POA: Diagnosis not present

## 2023-02-06 DIAGNOSIS — H0102B Squamous blepharitis left eye, upper and lower eyelids: Secondary | ICD-10-CM | POA: Diagnosis not present

## 2023-02-06 DIAGNOSIS — Z961 Presence of intraocular lens: Secondary | ICD-10-CM | POA: Diagnosis not present

## 2023-02-06 DIAGNOSIS — H01132 Eczematous dermatitis of right lower eyelid: Secondary | ICD-10-CM | POA: Diagnosis not present

## 2023-02-06 DIAGNOSIS — H04123 Dry eye syndrome of bilateral lacrimal glands: Secondary | ICD-10-CM | POA: Diagnosis not present

## 2023-02-06 DIAGNOSIS — H0102A Squamous blepharitis right eye, upper and lower eyelids: Secondary | ICD-10-CM | POA: Diagnosis not present

## 2023-02-06 DIAGNOSIS — H01135 Eczematous dermatitis of left lower eyelid: Secondary | ICD-10-CM | POA: Diagnosis not present

## 2023-02-06 DIAGNOSIS — H40013 Open angle with borderline findings, low risk, bilateral: Secondary | ICD-10-CM | POA: Diagnosis not present

## 2023-02-06 DIAGNOSIS — H01131 Eczematous dermatitis of right upper eyelid: Secondary | ICD-10-CM | POA: Diagnosis not present

## 2023-02-06 DIAGNOSIS — H01134 Eczematous dermatitis of left upper eyelid: Secondary | ICD-10-CM | POA: Diagnosis not present

## 2023-02-08 DIAGNOSIS — Z0100 Encounter for examination of eyes and vision without abnormal findings: Secondary | ICD-10-CM | POA: Diagnosis not present

## 2023-03-18 DIAGNOSIS — J45909 Unspecified asthma, uncomplicated: Secondary | ICD-10-CM | POA: Diagnosis not present

## 2023-03-18 DIAGNOSIS — M1009 Idiopathic gout, multiple sites: Secondary | ICD-10-CM | POA: Diagnosis not present

## 2023-03-18 DIAGNOSIS — Z6841 Body Mass Index (BMI) 40.0 and over, adult: Secondary | ICD-10-CM | POA: Diagnosis not present

## 2023-03-18 DIAGNOSIS — E038 Other specified hypothyroidism: Secondary | ICD-10-CM | POA: Diagnosis not present

## 2023-03-18 DIAGNOSIS — K580 Irritable bowel syndrome with diarrhea: Secondary | ICD-10-CM | POA: Diagnosis not present

## 2023-03-18 DIAGNOSIS — M5459 Other low back pain: Secondary | ICD-10-CM | POA: Diagnosis not present

## 2023-03-18 DIAGNOSIS — Z0000 Encounter for general adult medical examination without abnormal findings: Secondary | ICD-10-CM | POA: Diagnosis not present

## 2023-03-18 DIAGNOSIS — K219 Gastro-esophageal reflux disease without esophagitis: Secondary | ICD-10-CM | POA: Diagnosis not present

## 2023-03-18 DIAGNOSIS — I10 Essential (primary) hypertension: Secondary | ICD-10-CM | POA: Diagnosis not present

## 2023-06-24 DIAGNOSIS — M5459 Other low back pain: Secondary | ICD-10-CM | POA: Diagnosis not present

## 2023-06-24 DIAGNOSIS — I10 Essential (primary) hypertension: Secondary | ICD-10-CM | POA: Diagnosis not present

## 2023-06-24 DIAGNOSIS — K580 Irritable bowel syndrome with diarrhea: Secondary | ICD-10-CM | POA: Diagnosis not present

## 2023-06-24 DIAGNOSIS — M1009 Idiopathic gout, multiple sites: Secondary | ICD-10-CM | POA: Diagnosis not present

## 2023-06-24 DIAGNOSIS — N182 Chronic kidney disease, stage 2 (mild): Secondary | ICD-10-CM | POA: Diagnosis not present

## 2023-06-24 DIAGNOSIS — Z6841 Body Mass Index (BMI) 40.0 and over, adult: Secondary | ICD-10-CM | POA: Diagnosis not present

## 2023-06-24 DIAGNOSIS — E038 Other specified hypothyroidism: Secondary | ICD-10-CM | POA: Diagnosis not present

## 2023-06-24 DIAGNOSIS — K219 Gastro-esophageal reflux disease without esophagitis: Secondary | ICD-10-CM | POA: Diagnosis not present

## 2023-06-24 DIAGNOSIS — J45909 Unspecified asthma, uncomplicated: Secondary | ICD-10-CM | POA: Diagnosis not present

## 2023-09-25 DIAGNOSIS — M1009 Idiopathic gout, multiple sites: Secondary | ICD-10-CM | POA: Diagnosis not present

## 2023-09-25 DIAGNOSIS — Z6841 Body Mass Index (BMI) 40.0 and over, adult: Secondary | ICD-10-CM | POA: Diagnosis not present

## 2023-09-25 DIAGNOSIS — I10 Essential (primary) hypertension: Secondary | ICD-10-CM | POA: Diagnosis not present

## 2023-09-25 DIAGNOSIS — E038 Other specified hypothyroidism: Secondary | ICD-10-CM | POA: Diagnosis not present

## 2023-09-25 DIAGNOSIS — J45909 Unspecified asthma, uncomplicated: Secondary | ICD-10-CM | POA: Diagnosis not present

## 2023-09-25 DIAGNOSIS — K580 Irritable bowel syndrome with diarrhea: Secondary | ICD-10-CM | POA: Diagnosis not present

## 2023-09-25 DIAGNOSIS — M5459 Other low back pain: Secondary | ICD-10-CM | POA: Diagnosis not present

## 2023-09-25 DIAGNOSIS — N182 Chronic kidney disease, stage 2 (mild): Secondary | ICD-10-CM | POA: Diagnosis not present

## 2023-09-25 DIAGNOSIS — K219 Gastro-esophageal reflux disease without esophagitis: Secondary | ICD-10-CM | POA: Diagnosis not present

## 2023-11-27 DIAGNOSIS — I10 Essential (primary) hypertension: Secondary | ICD-10-CM | POA: Diagnosis not present

## 2023-11-27 DIAGNOSIS — M5432 Sciatica, left side: Secondary | ICD-10-CM | POA: Diagnosis not present

## 2023-11-27 DIAGNOSIS — Z6841 Body Mass Index (BMI) 40.0 and over, adult: Secondary | ICD-10-CM | POA: Diagnosis not present

## 2024-01-13 DIAGNOSIS — K219 Gastro-esophageal reflux disease without esophagitis: Secondary | ICD-10-CM | POA: Diagnosis not present

## 2024-01-13 DIAGNOSIS — K580 Irritable bowel syndrome with diarrhea: Secondary | ICD-10-CM | POA: Diagnosis not present

## 2024-01-13 DIAGNOSIS — N1831 Chronic kidney disease, stage 3a: Secondary | ICD-10-CM | POA: Diagnosis not present

## 2024-01-13 DIAGNOSIS — E038 Other specified hypothyroidism: Secondary | ICD-10-CM | POA: Diagnosis not present

## 2024-01-13 DIAGNOSIS — Z6841 Body Mass Index (BMI) 40.0 and over, adult: Secondary | ICD-10-CM | POA: Diagnosis not present

## 2024-01-13 DIAGNOSIS — M5432 Sciatica, left side: Secondary | ICD-10-CM | POA: Diagnosis not present

## 2024-01-13 DIAGNOSIS — I10 Essential (primary) hypertension: Secondary | ICD-10-CM | POA: Diagnosis not present

## 2024-01-13 DIAGNOSIS — M1009 Idiopathic gout, multiple sites: Secondary | ICD-10-CM | POA: Diagnosis not present

## 2024-02-19 ENCOUNTER — Encounter (HOSPITAL_COMMUNITY): Payer: Self-pay | Admitting: *Deleted

## 2024-02-26 DIAGNOSIS — H01135 Eczematous dermatitis of left lower eyelid: Secondary | ICD-10-CM | POA: Diagnosis not present

## 2024-02-26 DIAGNOSIS — H0102B Squamous blepharitis left eye, upper and lower eyelids: Secondary | ICD-10-CM | POA: Diagnosis not present

## 2024-02-26 DIAGNOSIS — H04123 Dry eye syndrome of bilateral lacrimal glands: Secondary | ICD-10-CM | POA: Diagnosis not present

## 2024-02-26 DIAGNOSIS — Z961 Presence of intraocular lens: Secondary | ICD-10-CM | POA: Diagnosis not present

## 2024-02-26 DIAGNOSIS — H01134 Eczematous dermatitis of left upper eyelid: Secondary | ICD-10-CM | POA: Diagnosis not present

## 2024-02-26 DIAGNOSIS — H01131 Eczematous dermatitis of right upper eyelid: Secondary | ICD-10-CM | POA: Diagnosis not present

## 2024-02-26 DIAGNOSIS — H40013 Open angle with borderline findings, low risk, bilateral: Secondary | ICD-10-CM | POA: Diagnosis not present

## 2024-02-26 DIAGNOSIS — H01132 Eczematous dermatitis of right lower eyelid: Secondary | ICD-10-CM | POA: Diagnosis not present

## 2024-02-26 DIAGNOSIS — H0102A Squamous blepharitis right eye, upper and lower eyelids: Secondary | ICD-10-CM | POA: Diagnosis not present

## 2024-04-20 DIAGNOSIS — K219 Gastro-esophageal reflux disease without esophagitis: Secondary | ICD-10-CM | POA: Diagnosis not present

## 2024-04-20 DIAGNOSIS — N1831 Chronic kidney disease, stage 3a: Secondary | ICD-10-CM | POA: Diagnosis not present

## 2024-04-20 DIAGNOSIS — M1009 Idiopathic gout, multiple sites: Secondary | ICD-10-CM | POA: Diagnosis not present

## 2024-04-20 DIAGNOSIS — E038 Other specified hypothyroidism: Secondary | ICD-10-CM | POA: Diagnosis not present

## 2024-04-20 DIAGNOSIS — M5432 Sciatica, left side: Secondary | ICD-10-CM | POA: Diagnosis not present

## 2024-04-20 DIAGNOSIS — Z6841 Body Mass Index (BMI) 40.0 and over, adult: Secondary | ICD-10-CM | POA: Diagnosis not present

## 2024-04-20 DIAGNOSIS — K580 Irritable bowel syndrome with diarrhea: Secondary | ICD-10-CM | POA: Diagnosis not present

## 2024-04-20 DIAGNOSIS — I10 Essential (primary) hypertension: Secondary | ICD-10-CM | POA: Diagnosis not present

## 2024-07-29 DIAGNOSIS — K580 Irritable bowel syndrome with diarrhea: Secondary | ICD-10-CM | POA: Diagnosis not present

## 2024-07-29 DIAGNOSIS — J301 Allergic rhinitis due to pollen: Secondary | ICD-10-CM | POA: Diagnosis not present

## 2024-07-29 DIAGNOSIS — I10 Essential (primary) hypertension: Secondary | ICD-10-CM | POA: Diagnosis not present

## 2024-07-29 DIAGNOSIS — M5432 Sciatica, left side: Secondary | ICD-10-CM | POA: Diagnosis not present

## 2024-07-29 DIAGNOSIS — N182 Chronic kidney disease, stage 2 (mild): Secondary | ICD-10-CM | POA: Diagnosis not present

## 2024-07-29 DIAGNOSIS — K219 Gastro-esophageal reflux disease without esophagitis: Secondary | ICD-10-CM | POA: Diagnosis not present

## 2024-07-29 DIAGNOSIS — E038 Other specified hypothyroidism: Secondary | ICD-10-CM | POA: Diagnosis not present

## 2024-07-29 DIAGNOSIS — M1009 Idiopathic gout, multiple sites: Secondary | ICD-10-CM | POA: Diagnosis not present

## 2024-09-02 DIAGNOSIS — I10 Essential (primary) hypertension: Secondary | ICD-10-CM | POA: Diagnosis not present

## 2024-09-02 DIAGNOSIS — N182 Chronic kidney disease, stage 2 (mild): Secondary | ICD-10-CM | POA: Diagnosis not present

## 2024-10-06 ENCOUNTER — Other Ambulatory Visit (HOSPITAL_COMMUNITY): Payer: Self-pay | Admitting: Internal Medicine

## 2024-10-06 DIAGNOSIS — M810 Age-related osteoporosis without current pathological fracture: Secondary | ICD-10-CM

## 2024-10-20 ENCOUNTER — Other Ambulatory Visit (HOSPITAL_COMMUNITY)

## 2024-11-02 ENCOUNTER — Encounter (HOSPITAL_COMMUNITY): Payer: Self-pay

## 2024-11-02 ENCOUNTER — Other Ambulatory Visit (HOSPITAL_COMMUNITY)

## 2024-11-16 ENCOUNTER — Ambulatory Visit (HOSPITAL_COMMUNITY)
Admission: RE | Admit: 2024-11-16 | Discharge: 2024-11-16 | Disposition: A | Discharge status: Home / Self Care | Source: Ambulatory Visit | Attending: Internal Medicine | Admitting: Internal Medicine

## 2024-11-16 DIAGNOSIS — M810 Age-related osteoporosis without current pathological fracture: Secondary | ICD-10-CM
